# Patient Record
Sex: Female | Born: 1990 | Race: White | Hispanic: No | Smoking: Never smoker
Health system: Southern US, Community
[De-identification: ages and names within clinical notes are randomized; demographics above are authoritative.]

## PROBLEM LIST (undated history)

## (undated) DIAGNOSIS — E739 Lactose intolerance, unspecified: Secondary | ICD-10-CM

## (undated) DIAGNOSIS — R5383 Other fatigue: Secondary | ICD-10-CM

## (undated) DIAGNOSIS — F419 Anxiety disorder, unspecified: Secondary | ICD-10-CM

## (undated) DIAGNOSIS — M549 Dorsalgia, unspecified: Secondary | ICD-10-CM

## (undated) DIAGNOSIS — F32A Depression, unspecified: Secondary | ICD-10-CM

## (undated) DIAGNOSIS — M25569 Pain in unspecified knee: Secondary | ICD-10-CM

## (undated) HISTORY — DX: Depression, unspecified: F32.A

## (undated) HISTORY — DX: Anxiety disorder, unspecified: F41.9

## (undated) HISTORY — DX: Other fatigue: R53.83

## (undated) HISTORY — DX: Pain in unspecified knee: M25.569

## (undated) HISTORY — DX: Lactose intolerance, unspecified: E73.9

## (undated) HISTORY — DX: Dorsalgia, unspecified: M54.9

---

## 2021-03-27 ENCOUNTER — Ambulatory Visit: Payer: Managed Care, Other (non HMO) | Admitting: Family

## 2021-03-27 ENCOUNTER — Encounter: Payer: Self-pay | Admitting: Family

## 2021-03-27 ENCOUNTER — Other Ambulatory Visit: Payer: Self-pay

## 2021-03-27 VITALS — BP 110/64 | HR 77 | Temp 98.1°F | Resp 18 | Ht 64.75 in | Wt 217.0 lb

## 2021-03-27 DIAGNOSIS — Z124 Encounter for screening for malignant neoplasm of cervix: Secondary | ICD-10-CM | POA: Diagnosis not present

## 2021-03-27 DIAGNOSIS — Z13220 Encounter for screening for lipoid disorders: Secondary | ICD-10-CM | POA: Diagnosis not present

## 2021-03-27 DIAGNOSIS — E669 Obesity, unspecified: Secondary | ICD-10-CM

## 2021-03-27 DIAGNOSIS — M545 Low back pain, unspecified: Secondary | ICD-10-CM

## 2021-03-27 DIAGNOSIS — R5383 Other fatigue: Secondary | ICD-10-CM

## 2021-03-27 LAB — COMPREHENSIVE METABOLIC PANEL
ALT: 13 U/L (ref 0–35)
AST: 13 U/L (ref 0–37)
Albumin: 3.9 g/dL (ref 3.5–5.2)
Alkaline Phosphatase: 87 U/L (ref 39–117)
BUN: 11 mg/dL (ref 6–23)
CO2: 25 mEq/L (ref 19–32)
Calcium: 8.6 mg/dL (ref 8.4–10.5)
Chloride: 105 mEq/L (ref 96–112)
Creatinine, Ser: 0.65 mg/dL (ref 0.40–1.20)
GFR: 120.6 mL/min (ref >60.00)
Glucose, Bld: 86 mg/dL (ref 70–99)
Potassium: 4.3 mEq/L (ref 3.5–5.1)
Sodium: 139 mEq/L (ref 135–145)
Total Bilirubin: 0.3 mg/dL (ref 0.2–1.2)
Total Protein: 6.5 g/dL (ref 6.0–8.3)

## 2021-03-27 LAB — CBC WITH DIFFERENTIAL/PLATELET
Basophils Absolute: 0 10*3/uL (ref 0.0–0.1)
Basophils Relative: 0.5 % (ref 0.0–3.0)
Eosinophils Absolute: 0.9 10*3/uL — ABNORMAL HIGH (ref 0.0–0.7)
Eosinophils Relative: 9.4 % — ABNORMAL HIGH (ref 0.0–5.0)
HCT: 40.5 % (ref 36.0–46.0)
Hemoglobin: 13.8 g/dL (ref 12.0–15.0)
Lymphocytes Relative: 27.9 % (ref 12.0–46.0)
Lymphs Abs: 2.7 10*3/uL (ref 0.7–4.0)
MCHC: 34 g/dL (ref 30.0–36.0)
MCV: 89.6 fl (ref 78.0–100.0)
Monocytes Absolute: 0.6 10*3/uL (ref 0.1–1.0)
Monocytes Relative: 6 % (ref 3.0–12.0)
Neutro Abs: 5.5 10*3/uL (ref 1.4–7.7)
Neutrophils Relative %: 56.2 % (ref 43.0–77.0)
Platelets: 305 10*3/uL (ref 150.0–400.0)
RBC: 4.52 Mil/uL (ref 3.87–5.11)
RDW: 12.5 % (ref 11.5–15.5)
WBC: 9.8 10*3/uL (ref 4.0–10.5)

## 2021-03-27 LAB — TSH: TSH: 2.37 u[IU]/mL (ref 0.35–4.50)

## 2021-03-27 LAB — LIPID PANEL
Cholesterol: 133 mg/dL (ref 0–200)
HDL: 43.9 mg/dL (ref >39.00)
LDL Cholesterol: 75 mg/dL (ref 0–99)
NonHDL: 89.5
Total CHOL/HDL Ratio: 3
Triglycerides: 73 mg/dL (ref 0.0–149.0)
VLDL: 14.6 mg/dL (ref 0.0–40.0)

## 2021-03-27 NOTE — Progress Notes (Signed)
Traci Long is a 30 y.o. female with the following history as recorded in Epic[facility]:  There are no problems to display for this patient.   No current outpatient medications on file.   No current facility-administered medications for this visit.    Allergies: Patient has no allergy information on record.  No past medical history on file.   The histories are not reviewed yet. Please review them in the "History" navigator section and refresh this [place].  No family history on file.  Social History   Tobacco Use  . Smoking status: Never Smoker  . Smokeless tobacco: Never Used  Substance Use Topics  . Alcohol use: Not Currently    Subjective:   Presents today as a new patient; has moved to the area in the past year to be closer to family; has 51 month old son; works in Counsellor;  Has been having some intermittent back pain- seems to have gotten worse in the past few weeks since she tripped and fell; no numbness/ tingling; limited relief with OTC medications; is scheduled to see chiropractor later today and notes she is not a "big medicine person." May want to consider seeing PT;   LMP- May 5/ no concerns for being pregnant ( last pap smear 2020)- planning to establish with OB-GYN  Having some increased focus/ fatigue issues since pregnancy 2021; does have family support but is a single parent at least 3 weeks out of the month;     Objective:  Vitals:   03/27/21 0917  BP: 110/64  Pulse: 77  Resp: 18  Temp: 98.1 F (36.7 C)  SpO2: 98%  Weight: 217 lb (98.4 kg)  Height: 5' 4.75" (1.645 m)    General: Well developed, well nourished, in no acute distress  Skin : Warm and dry.  Head: Normocephalic and atraumatic  Eyes: Sclera and conjunctiva clear; pupils round and reactive to light; extraocular movements intact  Ears: External normal; canals clear; tympanic membranes normal  Oropharynx: Pink, supple. No suspicious lesions  Neck: Supple without  thyromegaly, adenopathy  Lungs: Respirations unlabored; clear to auscultation bilaterally without wheeze, rales, rhonchi  CVS exam: normal rate and regular rhythm.  Musculoskeletal: No deformities; no active joint inflammation  Extremities: No edema, cyanosis, clubbing  Vessels: Symmetric bilaterally  Neurologic: Alert and oriented; speech intact; face symmetrical; moves all extremities well; CNII-XII intact without focal deficit   Assessment:  1. Obesity, unspecified classification, unspecified obesity type, unspecified whether serious comorbidity present   2. Cervical cancer screening   3. Other fatigue   4. Lipid screening   5. Low back pain without sciatica, unspecified back pain laterality, unspecified chronicity     Plan:  1. Refer to [facility] to discuss treatment options; encouraged to try to remain committed to exercise/ healthy eating; 2. Refer to GYN- needs to establish with new provider; 3. Update labs today;  4. Check lipid panel today; 5. Patient defers medication at this time- will see her chiropractor today and will let them manage X-rays for her; she may call back if she prefers to see PT in addition and referral can be done.   This visit occurred during the SARS-CoV-2 public health emergency.  Safety protocols were in place, including screening questions prior to the visit, additional usage of staff PPE, and extensive cleaning of exam room while observing appropriate contact time as indicated for disinfecting solutions.     No follow-ups on file.  Orders Placed This Encounter  Procedures  . CBC with Differential/Platelet  . Comp Met (CMET)  . TSH  . Lipid panel  . Amb Ref to Medical Weight Management    Referral Priority:   Routine    Referral Type:   Consultation    Number of Visits Requested:   1  . Ambulatory referral to Obstetrics / Gynecology    Referral Priority:   Routine    Referral Type:   Consultation    Referral Reason:    Specialty Services Required    Requested Specialty:   Obstetrics and Gynecology    Number of Visits Requested:   1    Requested Prescriptions    No prescriptions requested or ordered in this encounter

## 2021-05-21 ENCOUNTER — Encounter: Payer: Managed Care, Other (non HMO) | Admitting: Obstetrics & Gynecology

## 2021-06-27 DIAGNOSIS — Z0289 Encounter for other administrative examinations: Secondary | ICD-10-CM

## 2021-06-29 ENCOUNTER — Ambulatory Visit (INDEPENDENT_AMBULATORY_CARE_PROVIDER_SITE_OTHER): Payer: Managed Care, Other (non HMO) | Admitting: Family Medicine

## 2021-06-29 ENCOUNTER — Other Ambulatory Visit: Payer: Self-pay

## 2021-06-29 ENCOUNTER — Encounter (INDEPENDENT_AMBULATORY_CARE_PROVIDER_SITE_OTHER): Payer: Self-pay | Admitting: Family Medicine

## 2021-06-29 VITALS — BP 95/67 | HR 78 | Temp 98.0°F | Ht 64.0 in | Wt 217.0 lb

## 2021-06-29 DIAGNOSIS — Z9189 Other specified personal risk factors, not elsewhere classified: Secondary | ICD-10-CM | POA: Diagnosis not present

## 2021-06-29 DIAGNOSIS — Z6837 Body mass index (BMI) 37.0-37.9, adult: Secondary | ICD-10-CM

## 2021-06-29 DIAGNOSIS — F39 Unspecified mood [affective] disorder: Secondary | ICD-10-CM | POA: Diagnosis not present

## 2021-06-29 DIAGNOSIS — R0602 Shortness of breath: Secondary | ICD-10-CM | POA: Diagnosis not present

## 2021-06-29 DIAGNOSIS — R5383 Other fatigue: Secondary | ICD-10-CM | POA: Diagnosis not present

## 2021-06-29 DIAGNOSIS — Z1331 Encounter for screening for depression: Secondary | ICD-10-CM

## 2021-06-29 DIAGNOSIS — E739 Lactose intolerance, unspecified: Secondary | ICD-10-CM | POA: Diagnosis not present

## 2021-06-29 NOTE — Progress Notes (Signed)
Chief Complaint:   OBESITY ANALUCIA HUSH (MR# [id number]) is a 30 y.o. female who presents for evaluation and treatment of obesity and related comorbidities. Current BMI is Body mass index is 37.25 kg/m. Sugar has been struggling with her weight for many years and has been unsuccessful in either losing weight, maintaining weight loss, or reaching her healthy weight goal.  Kadesia is divorced and has a 58 year old son. 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 She notes intermit fasting and calorie counting worked best in the past. She craves pastries, carbohydrates. She snacks on cookies, chips, and nuts. She drinks calorie beverages.  Aracelly is currently in the action stage of change and ready to dedicate time achieving and maintaining a healthier weight. Velvet is interested in becoming our patient and working on intensive lifestyle modifications including (but not limited to) diet and exercise for weight loss.  Yajahira's habits were reviewed today and are as follows: Her family eats meals together, she struggles with family and or coworkers weight loss sabotage, her desired weight loss is 77 lbs, she has been heavy most of her life, she started gaining weight during pregnancy, her heaviest weight ever was 222 lbs pounds, she has significant food cravings issues, she snacks frequently in the evenings, she skips meals frequently, she is frequently drinking liquids with calories, she frequently makes poor food choices, she frequently eats larger portions than normal, she has binge eating behaviors, and she struggles with emotional eating.  Depression Screen Sundae's Food and Mood (modified PHQ-9) score was 14.  Depression screen PHQ 2/9 06/29/2021  Decreased Interest 1  Down, Depressed, Hopeless 2  PHQ - 2 Score 3  Altered sleeping 1  Tired, decreased energy 3  Change in appetite 1  Feeling bad or failure about yourself  2  Trouble concentrating 3  Moving slowly or  fidgety/restless 0  Suicidal thoughts 1  PHQ-9 Score 14  Difficult doing work/chores Somewhat difficult   Subjective:   1. Other fatigue Mckenze admits to daytime somnolence and admits to waking up still tired. Patent has a history of symptoms of daytime fatigue. Jamirra generally gets 4 or 5 hours of sleep per night, and states that she has nightime awakenings. Snoring is present. Apneic episodes are not present. Epworth Sleepiness Score is 6. EKG within normal limits, negative ABN appreciated. Normal sinus rhythm with heart rate 88-85 BPM.  2. Shortness of breath on exertion Raynah notes increasing shortness of breath with exercising and seems to be worsening over time with weight gain. She notes getting out of breath sooner with activity than she used to. This has not gotten worse recently. Siddhi denies shortness of breath at rest or orthopnea.  3. Lactose intolerance Kirston can eat milk, cheese, etc. She doesn't have to change her habits much at all.  4. Mood disorder (HCC), emotional eating Weltha notes depression and anxiety which was worse. She is taking Lexapro as needed. She not taking it now because she doesn't feel she is depressed or anxious. She states she used Lexapro for focus.  5. At risk for malnutrition Terrilee is at increased risk for malnutrition due to current dietary habits.  Assessment/Plan:   Orders Placed This Encounter  Procedures   Vitamin B12   CBC with Differential/Platelet   Comprehensive metabolic panel   Folate   Hemoglobin A1c   Insulin, random   T3   T4, free   TSH   VITAMIN D 25  Hydroxy (Vit-D Deficiency, Fractures)   Lipid Panel With LDL/HDL Ratio   EKG 12-Lead    There are no discontinued medications.   No orders of the defined types were placed in this encounter.    1. Other fatigue Allien does feel that her weight is causing her energy to be lower than it should be. Fatigue may be related to obesity, depression or many other causes. Labs will  be ordered, and in the meanwhile, Vendetta will focus on self care including making healthy food choices, increasing physical activity and focusing on stress reduction.  - EKG 12-Lead - Vitamin B12 - CBC with Differential/Platelet - Comprehensive metabolic panel - Folate - Hemoglobin A1c - Insulin, random - T3 - T4, free - TSH - VITAMIN D 25 Hydroxy (Vit-D Deficiency, Fractures) - Lipid Panel With LDL/HDL Ratio  2. Shortness of breath on exertion Revella does feel that she gets out of breath more easily that she used to when she exercises. Trachelle's shortness of breath appears to be obesity related and exercise induced. She has agreed to work on weight loss and gradually increase exercise to treat her exercise induced shortness of breath. Will continue to monitor closely.  3. Lactose intolerance Etienne will follow her prudent nutritional plan, and will monitor. She doesn't think it will be an issue eating on the plan.  4. Mood disorder (HCC), emotional eating Behavior modification techniques were discussed today to help Yehudit deal with her emotional/non-hunger eating behaviors. Mazelle's symptoms are well controlled currently. We will continue to monitor and will adjust her treatment plan as needed. Orders and follow up as documented in patient record.   5. Depression screening Johnetta had a positive depression screening. Depression is commonly associated with obesity and often results in emotional eating behaviors. We will monitor this closely and work on CBT to help improve the non-hunger eating patterns. Referral to Psychology may be required if no improvement is seen as she continues in our clinic.  6. At risk for malnutrition Selma was given extensive malnutrition prevention education and counseling today of more than 23 minutes.  Counseled her that malnutrition refers to inappropriate nutrients or not the right balance of nutrients for optimal health.  Discussed with Medrith K Hewson that it is  absolutely possible to be malnourished but yet obese.  Risk factors, including but not limited to, inappropriate dietary choices, difficulty with obtaining food due to physical or financial limitations, and various physical and mental health conditions were reviewed with Chastelyn K Cossin.   7. Class 2 severe obesity with serious comorbidity and body mass index (BMI) of 37.0 to 37.9 in adult, unspecified obesity type (HCC) Marjorie is currently in the action stage of change and her goal is to continue with weight loss efforts. I recommend Macaela begin the structured treatment plan as follows:  She has agreed to the Category 2 Plan.  Exercise goals: As is.   Behavioral modification strategies: meal planning and cooking strategies, keeping healthy foods in the home, and planning for success.  She was informed of the importance of frequent follow-up visits to maximize her success with intensive lifestyle modifications for her multiple health conditions. She was informed we would discuss her lab results at her next visit unless there is a critical issue that needs to be addressed sooner. Jurnee agreed to keep her next visit at the agreed upon time to discuss these results.  Objective:   Blood pressure 95/67, pulse 78, temperature 98 F (36.7 C), height 5\' 4"  (1.626  m), weight 217 lb (98.4 kg), SpO2 96 %. Body mass index is 37.25 kg/m.  EKG: Normal sinus rhythm, rate 87 BPM.  Indirect Calorimeter completed today shows a VO2 of 247 and a REE of 1699.  Her calculated basal metabolic rate is 6144 thus her basal metabolic rate is worse than expected.  General: Cooperative, alert, well developed, in no acute distress. HEENT: Conjunctivae and lids unremarkable. Cardiovascular: Regular rhythm.  Lungs: Normal work of breathing. Neurologic: No focal deficits.   Lab Results  Component Value Date   CREATININE 0.65 03/27/2021   BUN 11 03/27/2021   NA 139 03/27/2021   K 4.3 03/27/2021   CL 105 03/27/2021    CO2 25 03/27/2021   Lab Results  Component Value Date   ALT 13 03/27/2021   AST 13 03/27/2021   ALKPHOS 87 03/27/2021   BILITOT 0.3 03/27/2021   No results found for: HGBA1C No results found for: INSULIN Lab Results  Component Value Date   TSH 2.37 03/27/2021   Lab Results  Component Value Date   CHOL 133 03/27/2021   HDL 43.90 03/27/2021   LDLCALC 75 03/27/2021   TRIG 73.0 03/27/2021   CHOLHDL 3 03/27/2021   Lab Results  Component Value Date   WBC 9.8 03/27/2021   HGB 13.8 03/27/2021   HCT 40.5 03/27/2021   MCV 89.6 03/27/2021   PLT 305.0 03/27/2021   No results found for: IRON, TIBC, FERRITIN  Attestation Statements:   Reviewed by clinician on day of visit: allergies, medications, problem list, medical history, surgical history, family history, social history, and previous encounter notes.   Trude Mcburney, am acting as transcriptionist for [employer], DO.  I have reviewed the above documentation for accuracy and completeness, and I agree with the above. Carlye Grippe, D.O.  The 21st Century Cures Act was signed into law in 2016 which includes the topic of electronic health records.  This provides immediate access to information in MyChart.  This includes consultation notes, operative notes, office notes, lab results and pathology reports.  If you have any questions about what you read please let us know at your next visit so we can discuss your concerns and take corrective action if need be.  We are right here with you.

## 2021-07-09 ENCOUNTER — Other Ambulatory Visit (HOSPITAL_COMMUNITY)
Admission: RE | Admit: 2021-07-09 | Discharge: 2021-07-09 | Disposition: A | Discharge status: Home / Self Care | Payer: Managed Care, Other (non HMO) | Source: Ambulatory Visit | Attending: Obstetrics & Gynecology | Admitting: Obstetrics & Gynecology

## 2021-07-09 ENCOUNTER — Encounter: Payer: Self-pay | Admitting: Obstetrics & Gynecology

## 2021-07-09 ENCOUNTER — Ambulatory Visit (INDEPENDENT_AMBULATORY_CARE_PROVIDER_SITE_OTHER): Payer: Managed Care, Other (non HMO) | Admitting: Obstetrics & Gynecology

## 2021-07-09 ENCOUNTER — Other Ambulatory Visit: Payer: Self-pay

## 2021-07-09 VITALS — BP 92/66 | HR 75 | Ht 64.0 in | Wt 219.0 lb

## 2021-07-09 DIAGNOSIS — Z3009 Encounter for other general counseling and advice on contraception: Secondary | ICD-10-CM

## 2021-07-09 DIAGNOSIS — Z01419 Encounter for gynecological examination (general) (routine) without abnormal findings: Secondary | ICD-10-CM | POA: Diagnosis not present

## 2021-07-09 NOTE — Progress Notes (Signed)
Subjective:     Traci Long is a 30 y.o. female here for a routine exam. G1P1001. S/p c/s 18 months prev. Recenlty divorced and moved to [place] from [place]. Current complaints: prev had Skyla. Liked it however had severe cramps that made her pass out. She had it removed and 2 months later got pregnant. Not currently sexually active. No current GYN issues.    Gynecologic History Patient's last menstrual period was 06/24/2021. Contraception: none Last Pap: 1 year prev Last mammogram: n/a.   Obstetric History OB History  Gravida Para Term Preterm AB Living  1 1 1     1   SAB IAB Ectopic Multiple Live Births          1    # Outcome Date GA Lbr Len/2nd Weight Sex Delivery Anes PTL Lv  1 Term 2021 [redacted]w[redacted]d   M CS-LTranv EPI N LIV     The following portions of the patient's history were reviewed and updated as appropriate: allergies, current medications, past family history, past medical history, past social history, past surgical history, and problem list.  Review of Systems Pertinent items are noted in HPI.    Objective:  BP 92/66   Pulse 75   Ht 5\' 4"  (1.626 m)   Wt 219 lb (99.3 kg)   LMP 06/24/2021   BMI 37.59 kg/m   General Appearance:    Alert, cooperative, no distress, appears stated age  Head:    Normocephalic, without obvious abnormality, atraumatic  Eyes:    conjunctiva/corneas clear, EOM's intact, both eyes  Ears:    Normal external ear canals, both ears  Nose:   Nares normal, septum midline, mucosa normal, no drainage    or sinus tenderness  Throat:   Lips, mucosa, and tongue normal; teeth and gums normal  Neck:   Supple, symmetrical, trachea midline, no adenopathy;    thyroid:  no enlargement/tenderness/nodules  Back:     Symmetric, no curvature, ROM normal, no CVA tenderness  Lungs:     Clear to auscultation bilaterally, respirations unlabored  Chest Wall:    No tenderness or deformity   Heart:    Regular rate and rhythm, S1 and S2 normal, no murmur, rub   or  gallop  Breast Exam:    No tenderness, masses, or nipple abnormality  Abdomen:     Soft, non-tender, bowel sounds active all four quadrants,    no masses, no organomegaly  Genitalia:    Normal female without lesion, discharge or tenderness     Extremities:   Extremities normal, atraumatic, no cyanosis or edema  Pulses:   2+ and symmetric all extremities  Skin:   Skin color, texture, turgor normal, no rashes or lesions     Assessment:    Healthy female exam.  Contraception counseling. Reviewed options for future state. Questions answered.    Plan:    Diagnoses and all orders for this visit:  Well female exam with routine gynecological exam -     Cytology - PAP( [facility])  Encounter for counseling regarding contraception   Pt to f/u if she becomes sexually active and wants for LnIUD.  F/u in 1 year or sooner prn   Ottis Vacha L. Harraway-Smith, M.D., 

## 2021-07-10 LAB — CYTOLOGY - PAP: Diagnosis: NEGATIVE

## 2021-07-12 LAB — COMPREHENSIVE METABOLIC PANEL
ALT: 15 IU/L (ref 0–32)
AST: 18 IU/L (ref 0–40)
Albumin/Globulin Ratio: 1.5 (ref 1.2–2.2)
Albumin: 4.1 g/dL (ref 3.9–5.0)
Alkaline Phosphatase: 98 IU/L (ref 44–121)
BUN/Creatinine Ratio: 13 (ref 9–23)
BUN: 10 mg/dL (ref 6–20)
Bilirubin Total: 0.2 mg/dL (ref 0.0–1.2)
CO2: 20 mmol/L (ref 20–29)
Calcium: 8.9 mg/dL (ref 8.7–10.2)
Chloride: 104 mmol/L (ref 96–106)
Creatinine, Ser: 0.76 mg/dL (ref 0.57–1.00)
Globulin, Total: 2.7 g/dL (ref 1.5–4.5)
Glucose: 85 mg/dL (ref 65–99)
Potassium: 4.3 mmol/L (ref 3.5–5.2)
Sodium: 139 mmol/L (ref 134–144)
Total Protein: 6.8 g/dL (ref 6.0–8.5)
eGFR: 110 mL/min/{1.73_m2} (ref >59)

## 2021-07-12 LAB — CBC WITH DIFFERENTIAL/PLATELET
Basophils Absolute: 0 10*3/uL (ref 0.0–0.2)
Basos: 1 %
EOS (ABSOLUTE): 0.4 10*3/uL (ref 0.0–0.4)
Eos: 6 %
Hematocrit: 46.8 % — ABNORMAL HIGH (ref 34.0–46.6)
Hemoglobin: 14.5 g/dL (ref 11.1–15.9)
Immature Grans (Abs): 0.1 10*3/uL (ref 0.0–0.1)
Immature Granulocytes: 1 %
Lymphocytes Absolute: 1.8 10*3/uL (ref 0.7–3.1)
Lymphs: 24 %
MCH: 30 pg (ref 26.6–33.0)
MCHC: 31 g/dL — ABNORMAL LOW (ref 31.5–35.7)
MCV: 97 fL (ref 79–97)
Monocytes Absolute: 0.3 10*3/uL (ref 0.1–0.9)
Monocytes: 4 %
Neutrophils Absolute: 5 10*3/uL (ref 1.4–7.0)
Neutrophils: 64 %
Platelets: 365 10*3/uL (ref 150–450)
RBC: 4.84 x10E6/uL (ref 3.77–5.28)
RDW: 12.6 % (ref 11.7–15.4)
WBC: 7.7 10*3/uL (ref 3.4–10.8)

## 2021-07-12 LAB — INSULIN, RANDOM: INSULIN: 8.9 u[IU]/mL (ref 2.6–24.9)

## 2021-07-12 LAB — T3: T3, Total: 116 ng/dL (ref 71–180)

## 2021-07-12 LAB — SPECIMEN STATUS REPORT

## 2021-07-12 LAB — T4, FREE: Free T4: 1.14 ng/dL (ref 0.82–1.77)

## 2021-07-12 LAB — LIPID PANEL WITH LDL/HDL RATIO
Cholesterol, Total: 167 mg/dL (ref 100–199)
HDL: 46 mg/dL (ref >39)
LDL Chol Calc (NIH): 100 mg/dL — ABNORMAL HIGH (ref 0–99)
LDL/HDL Ratio: 2.2 ratio (ref 0.0–3.2)
Triglycerides: 114 mg/dL (ref 0–149)
VLDL Cholesterol Cal: 21 mg/dL (ref 5–40)

## 2021-07-12 LAB — HEMOGLOBIN A1C
Est. average glucose Bld gHb Est-mCnc: 103 mg/dL
Hgb A1c MFr Bld: 5.2 % (ref 4.8–5.6)

## 2021-07-12 LAB — VITAMIN B12: Vitamin B-12: 639 pg/mL (ref 232–1245)

## 2021-07-12 LAB — TSH: TSH: 2.82 u[IU]/mL (ref 0.450–4.500)

## 2021-07-12 LAB — FOLATE: Folate: 5.2 ng/mL (ref >3.0)

## 2021-07-12 LAB — VITAMIN D 25 HYDROXY (VIT D DEFICIENCY, FRACTURES): Vit D, 25-Hydroxy: 21.9 ng/mL — ABNORMAL LOW (ref 30.0–100.0)

## 2021-07-13 ENCOUNTER — Ambulatory Visit (INDEPENDENT_AMBULATORY_CARE_PROVIDER_SITE_OTHER): Payer: Managed Care, Other (non HMO) | Admitting: Family Medicine

## 2021-07-13 ENCOUNTER — Other Ambulatory Visit: Payer: Self-pay

## 2021-07-13 ENCOUNTER — Encounter (INDEPENDENT_AMBULATORY_CARE_PROVIDER_SITE_OTHER): Payer: Self-pay | Admitting: Family Medicine

## 2021-07-13 ENCOUNTER — Other Ambulatory Visit (HOSPITAL_BASED_OUTPATIENT_CLINIC_OR_DEPARTMENT_OTHER): Payer: Self-pay

## 2021-07-13 VITALS — BP 96/66 | HR 75 | Temp 98.2°F | Ht 64.0 in | Wt 214.0 lb

## 2021-07-13 DIAGNOSIS — F39 Unspecified mood [affective] disorder: Secondary | ICD-10-CM | POA: Diagnosis not present

## 2021-07-13 DIAGNOSIS — E8881 Metabolic syndrome: Secondary | ICD-10-CM

## 2021-07-13 DIAGNOSIS — Z9189 Other specified personal risk factors, not elsewhere classified: Secondary | ICD-10-CM

## 2021-07-13 DIAGNOSIS — E7849 Other hyperlipidemia: Secondary | ICD-10-CM | POA: Diagnosis not present

## 2021-07-13 DIAGNOSIS — E66812 Obesity, class 2: Secondary | ICD-10-CM

## 2021-07-13 DIAGNOSIS — Z6837 Body mass index (BMI) 37.0-37.9, adult: Secondary | ICD-10-CM

## 2021-07-13 DIAGNOSIS — E559 Vitamin D deficiency, unspecified: Secondary | ICD-10-CM | POA: Diagnosis not present

## 2021-07-13 DIAGNOSIS — E88819 Insulin resistance, unspecified: Secondary | ICD-10-CM

## 2021-07-13 MED ORDER — VITAMIN D (ERGOCALCIFEROL) 1.25 MG (50000 UNIT) PO CAPS
50000.0000 [IU] | ORAL_CAPSULE | ORAL | 0 refills | Status: DC
  Filled 2021-07-13: qty 4, 28d supply, fill #0

## 2021-07-16 NOTE — Progress Notes (Signed)
Chief Complaint:   OBESITY Traci Long is here to discuss her progress with her obesity treatment plan along with follow-up of her obesity related diagnoses. Traci Long is on the Category 2 Plan and states she is following her eating plan approximately 30% of the time. Traci Long states she is walking 30 minutes 2 times per week.  Today's visit was #: 2 Starting weight: 217 lbs Starting date: 06/29/2021 Today's weight: 214 lbs Today's date: 07/13/2021 Total lbs lost to date: 3 Total lbs lost since last in-office visit: 3  Interim History: Traci Long is here today for her first follow-up office visit since starting the program with [place].  All blood work/ lab tests that were recently ordered by myself or an outside provider were reviewed with patient today per their request.   Extended time was spent counseling her on all new disease processes that were discovered or preexisting ones that are affected by BMI.  she understands that many of these abnormalities will need to monitored regularly along with the current treatment plan of prudent dietary changes, in which we are making each and every office visit, to improve these health parameters. Traci Long had bad cravings because she couldn't eat all the foods on plan and found it very restrictive and boring. She doesn't like eating some things.    Subjective:   1. Insulin resistance New. Discussed labs with patient today. Traci Long has some cravings for bread and crackers. Medication: None  2. Vitamin D deficiency New. Discussed labs with patient today. She is currently taking no vitamin D supplement. She denies nausea, vomiting or muscle weakness.  Lab Results  Component Value Date   VD25OH 21.9 (L) 07/02/2021   3. Other hyperlipidemia New. Discussed labs with patient today. Pt's LDL is slightly elevated. Medication: None  4. Mood disorder (HCC), emotional eating Discussed labs with patient today. While working from home, pt mindlessly eats and snacks  a lot. She has been off Lexapro for about a month and doesn't feel she needs any medication right now.  5. At risk for diabetes mellitus Traci Long is at higher than average risk for developing diabetes due to new onset insulin resistance.   Assessment/Plan:  No orders of the defined types were placed in this encounter.   Medications Discontinued During This Encounter  Medication Reason   Vitamin D, Ergocalciferol, (DRISDOL) 1.25 MG (50000 UNIT) CAPS capsule Reorder     Meds ordered this encounter  Medications   Vitamin D, Ergocalciferol, (DRISDOL) 1.25 MG (50000 UNIT) CAPS capsule    Sig: Take 1 capsule (50,000 Units total) by mouth every 7 (seven) days.    Dispense:  4 capsule    Refill:  0     1. Insulin resistance Traci Long will continue to work on weight loss, exercise, and decreasing simple carbohydrates to help decrease the risk of diabetes. Traci Long agreed to follow-up with [place] as directed to closely monitor her progress. Extensive discussion and education was done and handouts provided. We also discussed Metformin and handout provided on medication. Pt will read and let us know if she'd like to start the medication.  2. Vitamin D deficiency Low Vitamin D level contributes to fatigue and are associated with obesity, breast, and colon cancer. She agrees to start prescription Vitamin D 50,000 IU every week and will follow-up for routine testing of Vitamin D, at least 2-3 times per year to avoid over-replacement.  Start- Vitamin D, Ergocalciferol, (DRISDOL) 1.25 MG (50000 UNIT) CAPS capsule; Take 1 capsule (50,000  Units total) by mouth every 7 (seven) days.  Dispense: 4 capsule; Refill: 0  3. Other hyperlipidemia Cardiovascular risk and specific lipid/LDL goals reviewed.  We discussed several lifestyle modifications today and Traci Long will continue to work on diet, exercise and weight loss efforts. Orders and follow up as documented in patient record. Decrease saturated and trans fats.  Counseling done and we will continue to monitor every 4-6 months.  Counseling Intensive lifestyle modifications are the first line treatment for this issue. Dietary changes: Increase soluble fiber. Decrease simple carbohydrates. Exercise changes: Moderate to vigorous-intensity aerobic activity 150 minutes per week if tolerated. Lipid-lowering medications: see documented in medical record.  4. Mood disorder (HCC), emotional eating Refer to Dr. Dewaine Conger. Would recommend Wellbutrin to PCP for mood management in the future, if they felt it was an appropriate choices.  5. At risk for diabetes mellitus Traci Long was given approximately 23 minutes of diabetes education and counseling today. We discussed intensive lifestyle modifications today with an emphasis on weight loss as well as increasing exercise and decreasing simple carbohydrates in her diet. We also reviewed medication options with an emphasis on risk versus benefit of those discussed.   Repetitive spaced learning was employed today to elicit superior memory formation and behavioral change.  6. Obesity with current BMI of 36.9  Traci Long is currently in the action stage of change. As such, her goal is to continue with weight loss efforts. She has agreed to keeping a food journal and adhering to recommended goals of 1200-1300 calories and 95+ grams protein- using category 2 as a guide.   Bring journaling log to next OV. Pt was counseled that there is a lot of variation to meal plan. Food prep discussed with pt.  Exercise goals:  As is  Behavioral modification strategies: decreasing simple carbohydrates, increasing water intake, and keeping a strict food journal.  Traci Long has agreed to follow-up with our clinic in 2 weeks. She was informed of the importance of frequent follow-up visits to maximize her success with intensive lifestyle modifications for her multiple health conditions.   Objective:   Blood pressure 96/66, pulse 75, temperature 98.2  F (36.8 C), height 5\' 4"  (1.626 m), last menstrual period 06/24/2021, SpO2 97 %. Body mass index is 37.59 kg/m.  General: Cooperative, alert, well developed, in no acute distress. HEENT: Conjunctivae and lids unremarkable. Cardiovascular: Regular rhythm.  Lungs: Normal work of breathing. Neurologic: No focal deficits.   Lab Results  Component Value Date   CREATININE 0.76 07/02/2021   BUN 10 07/02/2021   NA 139 07/02/2021   K 4.3 07/02/2021   CL 104 07/02/2021   CO2 20 07/02/2021   Lab Results  Component Value Date   ALT 15 07/02/2021   AST 18 07/02/2021   ALKPHOS 98 07/02/2021   BILITOT 0.2 07/02/2021   Lab Results  Component Value Date   HGBA1C 5.2 07/02/2021   Lab Results  Component Value Date   INSULIN 8.9 07/02/2021   Lab Results  Component Value Date   TSH 2.820 07/02/2021   Lab Results  Component Value Date   CHOL 167 07/02/2021   HDL 46 07/02/2021   LDLCALC 100 (H) 07/02/2021   TRIG 114 07/02/2021   CHOLHDL 3 03/27/2021   Lab Results  Component Value Date   VD25OH 21.9 (L) 07/02/2021   Lab Results  Component Value Date   WBC 7.7 07/02/2021   HGB 14.5 07/02/2021   HCT 46.8 (H) 07/02/2021   MCV 97 07/02/2021  PLT 365 07/02/2021    Attestation Statements:   Reviewed by clinician on day of visit: allergies, medications, problem list, medical history, surgical history, family history, social history, and previous encounter notes.  Edmund Hilda, CMA, am acting as transcriptionist for [employer], DO.  I have reviewed the above documentation for accuracy and completeness, and I agree with the above. Carlye Grippe, D.O.  The 21st Century Cures Act was signed into law in 2016 which includes the topic of electronic health records.  This provides immediate access to information in MyChart.  This includes consultation notes, operative notes, office notes, lab results and pathology reports.  If you have any questions about what you read  please let us know at your next visit so we can discuss your concerns and take corrective action if need be.  We are right here with you.

## 2021-07-23 ENCOUNTER — Other Ambulatory Visit (HOSPITAL_BASED_OUTPATIENT_CLINIC_OR_DEPARTMENT_OTHER): Payer: Self-pay

## 2021-07-24 ENCOUNTER — Other Ambulatory Visit: Payer: Self-pay

## 2021-07-24 ENCOUNTER — Encounter (INDEPENDENT_AMBULATORY_CARE_PROVIDER_SITE_OTHER): Payer: Self-pay | Admitting: Family Medicine

## 2021-07-24 ENCOUNTER — Other Ambulatory Visit (HOSPITAL_BASED_OUTPATIENT_CLINIC_OR_DEPARTMENT_OTHER): Payer: Self-pay

## 2021-07-24 ENCOUNTER — Ambulatory Visit (INDEPENDENT_AMBULATORY_CARE_PROVIDER_SITE_OTHER): Payer: Managed Care, Other (non HMO) | Admitting: Family Medicine

## 2021-07-24 VITALS — BP 114/77 | HR 74 | Temp 97.4°F | Ht 64.0 in | Wt 220.0 lb

## 2021-07-24 DIAGNOSIS — Z9189 Other specified personal risk factors, not elsewhere classified: Secondary | ICD-10-CM | POA: Diagnosis not present

## 2021-07-24 DIAGNOSIS — E559 Vitamin D deficiency, unspecified: Secondary | ICD-10-CM

## 2021-07-24 DIAGNOSIS — Z6837 Body mass index (BMI) 37.0-37.9, adult: Secondary | ICD-10-CM

## 2021-07-24 MED ORDER — VITAMIN D (ERGOCALCIFEROL) 1.25 MG (50000 UNIT) PO CAPS
50000.0000 [IU] | ORAL_CAPSULE | ORAL | 0 refills | Status: DC
  Filled 2021-07-24: qty 4, 28d supply, fill #0

## 2021-07-24 NOTE — Progress Notes (Signed)
Chief Complaint:   OBESITY Traci Long is here to discuss her progress with her obesity treatment plan along with follow-up of her obesity related diagnoses. Traci Long is on keeping a food journal and adhering to recommended goals of 1200-1300 calories and 95+ grams of protein daily and states she is following her eating plan approximately 70-80% of the time. [name] states she is doing yoga, walking, and zumba for 60 minutes 3 times per week.  Today's visit was #: 3 Starting weight: 217 lbs Starting date: 06/29/2021 Today's weight: 220 lbs Today's date: 07/24/2021 Total lbs lost to date: 0 Total lbs lost since last in-office visit:0  Interim History: Traci Long states she did better than before even though she gained weight today.  Journaling has helped, and she did it for 9 days and was surprised at the calorie content and carbohydrate content of the items. She was over in calories every day and under in protein half of the days.  She eats out 3-4 days per week, which makes it challenging.    Subjective:   1. Vitamin D deficiency Shirle is currently taking prescription vitamin D 50,000 IU each week. She started her Vit D last week. She denies nausea, vomiting or muscle weakness.  2. At risk for impaired metabolic function Traci Long is at increased risk for impaired metabolic function due to insulin resistance.   Assessment/Plan:  No orders of the defined types were placed in this encounter.   Medications Discontinued During This Encounter  Medication Reason   escitalopram (LEXAPRO) 10 MG tablet Error   Vitamin D, Ergocalciferol, (DRISDOL) 1.25 MG (50000 UNIT) CAPS capsule Reorder     Meds ordered this encounter  Medications   Vitamin D, Ergocalciferol, (DRISDOL) 1.25 MG (50000 UNIT) CAPS capsule    Sig: Take 1 capsule (50,000 Units total) by mouth every 7 (seven) days.    Dispense:  4 capsule    Refill:  0    30 d supply;  ** OV for RF **   Do not send RF request     1. Vitamin D  deficiency Low Vitamin D level contributes to fatigue and are associated with obesity, breast, and colon cancer. We will refill prescription Vitamin D for 1 month. Aamya will follow-up for routine testing of Vitamin D, at least 2-3 times per year to avoid over-replacement.  - Vitamin D, Ergocalciferol, (DRISDOL) 1.25 MG (50000 UNIT) CAPS capsule; Take 1 capsule (50,000 Units total) by mouth every 7 (seven) days.  Dispense: 4 capsule; Refill: 0  2. At risk for impaired metabolic function Traci Long was given approximately 9 minutes of impaired  metabolic function prevention counseling today. We discussed intensive lifestyle modifications today with an emphasis on specific nutrition and exercise instructions and strategies.   Repetitive spaced learning was employed today to elicit superior memory formation and behavioral change.  3. Obesity with current BMI of 37.8 Traci Long is currently in the action stage of change. As such, her goal is to continue with weight loss efforts. She has agreed to keeping a food journal and adhering to recommended goals of 1200-1300 calories and 95+ grams of protein daily.   Exercise goals: For substantial health benefits, adults should do at least 150 minutes (2 hours and 30 minutes) a week of moderate-intensity, or 75 minutes (1 hour and 15 minutes) a week of vigorous-intensity aerobic physical activity, or an equivalent combination of moderate- and vigorous-intensity aerobic activity. Aerobic activity should be performed in episodes of at least 10 minutes, and preferably,  it should be spread throughout the week.  Behavioral modification strategies: increasing lean protein intake, decreasing simple carbohydrates, decreasing liquid calories, and keeping a strict food journal.  Traci Long has agreed to follow-up with our clinic in 2 to 3 weeks. She was informed of the importance of frequent follow-up visits to maximize her success with intensive lifestyle modifications for her multiple  health conditions.   Objective:   Blood pressure 114/77, pulse 74, temperature (!) 97.4 F (36.3 C), height 5\' 4"  (1.626 m), weight 220 lb (99.8 kg), last menstrual period 06/24/2021, SpO2 98 %. Body mass index is 37.76 kg/m.  General: Cooperative, alert, well developed, in no acute distress. HEENT: Conjunctivae and lids unremarkable. Cardiovascular: Regular rhythm.  Lungs: Normal work of breathing. Neurologic: No focal deficits.   Lab Results  Component Value Date   CREATININE 0.76 07/02/2021   BUN 10 07/02/2021   NA 139 07/02/2021   K 4.3 07/02/2021   CL 104 07/02/2021   CO2 20 07/02/2021   Lab Results  Component Value Date   ALT 15 07/02/2021   AST 18 07/02/2021   ALKPHOS 98 07/02/2021   BILITOT 0.2 07/02/2021   Lab Results  Component Value Date   HGBA1C 5.2 07/02/2021   Lab Results  Component Value Date   INSULIN 8.9 07/02/2021   Lab Results  Component Value Date   TSH 2.820 07/02/2021   Lab Results  Component Value Date   CHOL 167 07/02/2021   HDL 46 07/02/2021   LDLCALC 100 (H) 07/02/2021   TRIG 114 07/02/2021   CHOLHDL 3 03/27/2021   Lab Results  Component Value Date   VD25OH 21.9 (L) 07/02/2021   Lab Results  Component Value Date   WBC 7.7 07/02/2021   HGB 14.5 07/02/2021   HCT 46.8 (H) 07/02/2021   MCV 97 07/02/2021   PLT 365 07/02/2021   No results found for: IRON, TIBC, FERRITIN  Attestation Statements:   Reviewed by clinician on day of visit: allergies, medications, problem list, medical history, surgical history, family history, social history, and previous encounter notes.   09/01/2021, am acting as transcriptionist for Trude Mcburney, DO.  I have reviewed the above documentation for accuracy and completeness, and I agree with the above. [employer], D.O.  The 21st Century Cures Act was signed into law in 2016 which includes the topic of electronic health records.  This provides immediate access to information in  MyChart.  This includes consultation notes, operative notes, office notes, lab results and pathology reports.  If you have any questions about what you read please let 2017 know at your next visit so we can discuss your concerns and take corrective action if need be.  We are right here with you.
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
## 2021-08-02 ENCOUNTER — Other Ambulatory Visit (HOSPITAL_BASED_OUTPATIENT_CLINIC_OR_DEPARTMENT_OTHER): Payer: Self-pay

## 2021-08-07 ENCOUNTER — Telehealth (INDEPENDENT_AMBULATORY_CARE_PROVIDER_SITE_OTHER): Payer: 59 | Admitting: Psychology

## 2021-08-07 DIAGNOSIS — F3289 Other specified depressive episodes: Secondary | ICD-10-CM

## 2021-08-07 NOTE — Progress Notes (Incomplete)
  Office: [phone]  /  Fax: [phone]    Date: August 21, 2021   Appointment Start Time: *** Duration: *** minutes Provider: Lawerance Cruel, Psy.D. Type of Session: Individual Therapy  Location of Patient: {gbptloc:23249} Location of Provider: Provider's Home (private office) Type of Contact: Telepsychological Visit via MyChart Video Visit  Session Content: Traci Long is a 30 y.o. female presenting for a follow-up appointment to address the previously established treatment goal of increasing coping skills.Today's appointment was a telepsychological visit due to COVID-19. Traci Long provided verbal consent for today's telepsychological appointment and she is aware she is responsible for securing confidentiality on her end of the session. Prior to proceeding with today's appointment, Traci Long's physical location at the time of this appointment was obtained as well a phone number she could be reached at in the event of technical difficulties. Traci Long and this provider participated in today's telepsychological service.   This provider conducted a brief check-in. *** Traci Long was receptive to today's appointment as evidenced by openness to sharing, responsiveness to feedback, and {gbreceptiveness:23401}.  Mental Status Examination:  Appearance: {Appearance:22431} Behavior: {Behavior:22445} Mood: {gbmood:21757} Affect: {Affect:22436} Speech: {Speech:22432} Eye Contact: {Eye Contact:22433} Psychomotor Activity: {Motor Activity:22434} Gait: {gbgait:23404} Thought Process: {thought process:22448}  Thought Content/Perception: {disturbances:22451} Orientation: {Orientation:22437} Memory/Concentration: {gbcognition:22449} Insight/Judgment: {Insight:22446}  Interventions:  {Interventions for Progress Notes:23405}  DSM-5 Diagnosis(es): F32.89 Other Specified Depressive Disorder, Emotional Eating Behaviors  Treatment Goal & Progress: During the initial appointment with this provider, the following  treatment goal was established: increase coping skills. Traci Long has demonstrated progress in her goal as evidenced by {gbtxprogress:22839}. Traci Long also {gbtxprogress2:22951}.  Plan: The next appointment will be scheduled in {gbweeks:21758}, which will be {gbtxmodality:23402}. The next session will focus on {Plan for Next Appointment:23400}.

## 2021-08-08 ENCOUNTER — Encounter (INDEPENDENT_AMBULATORY_CARE_PROVIDER_SITE_OTHER): Payer: Self-pay | Admitting: Bariatrics

## 2021-08-08 ENCOUNTER — Other Ambulatory Visit: Payer: Self-pay

## 2021-08-08 ENCOUNTER — Telehealth (INDEPENDENT_AMBULATORY_CARE_PROVIDER_SITE_OTHER): Payer: Managed Care, Other (non HMO) | Admitting: Bariatrics

## 2021-08-08 DIAGNOSIS — Z6837 Body mass index (BMI) 37.0-37.9, adult: Secondary | ICD-10-CM

## 2021-08-08 DIAGNOSIS — E785 Hyperlipidemia, unspecified: Secondary | ICD-10-CM

## 2021-08-08 DIAGNOSIS — E7849 Other hyperlipidemia: Secondary | ICD-10-CM | POA: Diagnosis not present

## 2021-08-08 DIAGNOSIS — E8881 Metabolic syndrome: Secondary | ICD-10-CM | POA: Diagnosis not present

## 2021-08-08 NOTE — Progress Notes (Signed)
TeleHealth Visit:  Due to the COVID-19 pandemic, this visit was completed with telemedicine (audio/video) technology to reduce patient and provider exposure as well as to preserve personal protective equipment.   Traci Long has verbally consented to this TeleHealth visit. The patient is located at home, the provider is located at the [facility] office. The participants in this visit include the listed provider and patient. The visit was conducted today via video.   Chief Complaint: OBESITY Traci Long is here to discuss her progress with her obesity treatment plan along with follow-up of her obesity related diagnoses. Traci Long is on keeping a food journal and adhering to recommended goals of 1200-1300 calories and 95 grams of protein and states she is following her eating plan approximately 70% of the time. Traci Long states she is walking for 30-45 minutes 2-3 times per week.  Today's visit was #: 4 Starting weight: 217 lbs Starting date: 06/29/2021  Interim History: Traci Long is sick today with a fever, vomiting and diarrhea. She requested a video visit. She usually sees Dr. Sharee Holster. She thinks she did lose weight.   Subjective:   1. Insulin resistance [facility] is not currently on medications.   2. Other hyperlipidemia Traci Long is not on medications currently.   Assessment/Plan:   1. Insulin resistance Traci Long will continue to work on weight loss, activities, exercise, and decreasing simple carbohydrates to help decrease the risk of diabetes. Traci Long agreed to follow-up with [place] as directed to closely monitor her progress.  2. Other hyperlipidemia Cardiovascular risk and specific lipid/LDL goals reviewed.  We discussed several lifestyle modifications today and Adel will continue to work on diet, exercise and weight loss efforts. Traci Long will have zero trans fats and she will decrease saturated fats. She will increase healthy fats Traci Long. Orders and follow up as documented in patient record.    Counseling Intensive lifestyle modifications are the first line treatment for this issue. Dietary changes: Increase soluble fiber. Decrease simple carbohydrates. Exercise changes: Moderate to vigorous-intensity aerobic activity 150 minutes per week if tolerated. Lipid-lowering medications: see documented in medical record.   3. Obesity with current BMI of 37.7 Traci Long is currently in the action stage of change. As such, her goal is to continue with weight loss efforts. She has agreed to keeping a food journal and adhering to recommended goals of 1200-1300 calories and 95 plus grams of protein.   Traci Long will continue meal planning. She will adhere strongly to the plan. She will increase water intake. She will turn on water app on her phone.  Exercise goals:  As is.  Behavioral modification strategies: increasing lean protein intake, decreasing simple carbohydrates, increasing vegetables, increasing water intake, decreasing eating out, no skipping meals, meal planning and cooking strategies, keeping healthy foods in the home, and planning for success.  Traci Long has agreed to follow-up with our clinic in 2 weeks. She was informed of the importance of frequent follow-up visits to maximize her success with intensive lifestyle modifications for her multiple health conditions.  Objective:   VITALS: Per patient if applicable, see vitals. GENERAL: Alert and in no acute distress. CARDIOPULMONARY: No increased WOB. Speaking in clear sentences.  PSYCH: Pleasant and cooperative. Speech normal rate and rhythm. Affect is appropriate. Insight and judgement are appropriate. Attention is focused, linear, and appropriate.  NEURO: Oriented as arrived to appointment on time with no prompting.   Lab Results  Component Value Date   CREATININE 0.76 07/02/2021   BUN 10 07/02/2021   NA 139  07/02/2021   K 4.3 07/02/2021   CL 104 07/02/2021   CO2 20 07/02/2021   Lab Results  Component Value Date   ALT 15  07/02/2021   AST 18 07/02/2021   ALKPHOS 98 07/02/2021   BILITOT 0.2 07/02/2021   Lab Results  Component Value Date   HGBA1C 5.2 07/02/2021   Lab Results  Component Value Date   INSULIN 8.9 07/02/2021   Lab Results  Component Value Date   TSH 2.820 07/02/2021   Lab Results  Component Value Date   CHOL 167 07/02/2021   HDL 46 07/02/2021   LDLCALC 100 (H) 07/02/2021   TRIG 114 07/02/2021   CHOLHDL 3 03/27/2021   Lab Results  Component Value Date   VD25OH 21.9 (L) 07/02/2021   Lab Results  Component Value Date   WBC 7.7 07/02/2021   HGB 14.5 07/02/2021   HCT 46.8 (H) 07/02/2021   MCV 97 07/02/2021   PLT 365 07/02/2021   No results found for: IRON, TIBC, FERRITIN  Attestation Statements:   Reviewed by clinician on day of visit: allergies, medications, problem list, medical history, surgical history, family history, social history, and previous encounter notes.  I, Jackson Latino, RMA, am acting as Energy manager for [employer], DO.   I have reviewed the above documentation for accuracy and completeness, and I agree with the above. Corinna Capra, DO

## 2021-08-09 ENCOUNTER — Encounter (INDEPENDENT_AMBULATORY_CARE_PROVIDER_SITE_OTHER): Payer: Self-pay | Admitting: Bariatrics

## 2021-08-09 DIAGNOSIS — E785 Hyperlipidemia, unspecified: Secondary | ICD-10-CM | POA: Insufficient documentation

## 2021-08-09 DIAGNOSIS — E6609 Other obesity due to excess calories: Secondary | ICD-10-CM | POA: Insufficient documentation

## 2021-08-09 DIAGNOSIS — Z6837 Body mass index (BMI) 37.0-37.9, adult: Secondary | ICD-10-CM | POA: Insufficient documentation

## 2021-08-20 NOTE — Progress Notes (Signed)
  Office: [phone]  /  Fax: [phone]    Date: August 27, 2021   Appointment Start Time: 3:30pm Duration: 26 minutes Provider: Lawerance Cruel, Psy.D. Type of Session: Individual Therapy  Location of Patient: Home (private location) Location of Provider: Provider's Home (private office) Type of Contact: Telepsychological Visit via MyChart Video Visit  Session Content:This provider called Traci Long at 3:30pm due to connectivity issues via MyChart Video Visit. Connection was established via Restaurant manager, fast food Visit.    Traci Long is a 30 y.o. female presenting for a follow-up appointment to address the previously established treatment goal of increasing coping skills.Today's appointment was a telepsychological visit due to COVID-19. Traci Long provided verbal consent for today's telepsychological appointment and she is aware she is responsible for securing confidentiality on her end of the session. Prior to proceeding with today's appointment, Traci Long's physical location at the time of this appointment was obtained as well a phone number she could be reached at in the event of technical difficulties. Traci Long and this provider participated in today's telepsychological service.   This provider conducted a brief check-in. Traci Long shared about recent events, including being busy with work and planning an event for her son. She acknowledged engaging in emotional eating behaviors, noting she often feels like she does not have time for herself. As such, psychoeducation regarding the importance of self-care utilizing the oxygen mask metaphor was provided. Additionally, psychoeducation regarding pleasurable activities, including its impact on emotional eating and overall well-being was provided. Traci Long was provided with a handout with various options of pleasurable activities, and was encouraged to engage in one activity a day and additional activities as needed when triggered to emotionally eat. Traci Long agreed. Traci Long provided  verbal consent during today's appointment for this provider to send a handout with pleasurable activities via e-mail. Overall, Traci Long was receptive to today's appointment as evidenced by openness to sharing, responsiveness to feedback, and willingness to engage in pleasurable activities to assist with coping.  Mental Status Examination:  Appearance: well groomed and appropriate hygiene  Behavior: appropriate to circumstances Mood: euthymic Affect: mood congruent Speech: normal in rate, volume, and tone Eye Contact: appropriate Psychomotor Activity: appropriate Gait: unable to assess Thought Process: linear, logical, and goal directed  Thought Content/Perception: denies suicidal and homicidal ideation, plan, and intent, no hallucinations, delusions, bizarre thinking or behavior reported or observed, and denies ideation and engagement in self-injurious behaviors Orientation: time, person, place, and purpose of appointment Memory/Concentration: memory, attention, language, and fund of knowledge intact  Insight/Judgment: fair  Interventions:  Conducted a brief chart review Conducted a risk assessment Provided empathic reflections and validation Employed supportive psychotherapy interventions to facilitate reduced distress and to improve coping skills with identified stressors Psychoeducation provided regarding pleasurable activities Psychoeducation provided regarding self-care  DSM-5 Diagnosis(es): F32.89 Other Specified Depressive Disorder, Emotional Eating Behaviors  Treatment Goal & Progress: During the initial appointment with this provider, the following treatment goal was established: increase coping skills. Progress is limited, as Traci Long has just begun treatment with this provider; however, she is receptive to the interaction and interventions and rapport is being established.   Plan: The next appointment will be scheduled in two weeks, which will be via MyChart Video Visit. The next  session will focus on working towards the established treatment goal. Additionally, Traci Long reported a plan to complete and return paperwork to [facility] to schedule an initial appointment. She also reported a plan to contact her gynecologist as previously discussed.

## 2021-08-21 ENCOUNTER — Telehealth (INDEPENDENT_AMBULATORY_CARE_PROVIDER_SITE_OTHER): Payer: 59 | Admitting: Psychology

## 2021-08-22 ENCOUNTER — Ambulatory Visit (INDEPENDENT_AMBULATORY_CARE_PROVIDER_SITE_OTHER): Payer: Managed Care, Other (non HMO) | Admitting: Family Medicine

## 2021-08-22 ENCOUNTER — Other Ambulatory Visit: Payer: Self-pay

## 2021-08-22 ENCOUNTER — Encounter (INDEPENDENT_AMBULATORY_CARE_PROVIDER_SITE_OTHER): Payer: Self-pay | Admitting: Family Medicine

## 2021-08-22 VITALS — BP 105/74 | HR 71 | Temp 97.4°F | Ht 64.0 in | Wt 217.0 lb

## 2021-08-22 DIAGNOSIS — E8881 Metabolic syndrome: Secondary | ICD-10-CM | POA: Diagnosis not present

## 2021-08-22 DIAGNOSIS — Z6837 Body mass index (BMI) 37.0-37.9, adult: Secondary | ICD-10-CM | POA: Diagnosis not present

## 2021-08-22 DIAGNOSIS — Z9189 Other specified personal risk factors, not elsewhere classified: Secondary | ICD-10-CM | POA: Diagnosis not present

## 2021-08-22 DIAGNOSIS — E559 Vitamin D deficiency, unspecified: Secondary | ICD-10-CM

## 2021-08-22 MED ORDER — METFORMIN HCL 500 MG PO TABS: ORAL_TABLET | ORAL | 0 refills | Status: DC

## 2021-08-22 MED ORDER — VITAMIN D (ERGOCALCIFEROL) 1.25 MG (50000 UNIT) PO CAPS: 50000.0000 [IU] | ORAL_CAPSULE | ORAL | 0 refills | Status: DC

## 2021-08-22 NOTE — Progress Notes (Signed)
Chief Complaint:   OBESITY Traci Long is here to discuss her progress with her obesity treatment plan along with follow-up of her obesity related diagnoses. Traci Long is on keeping a food journal and adhering to recommended goals of 1200-1300 calories and 95 grams protein and states she is following her eating plan approximately 80% of the time. [name] states she is walking and doing yoga 60 minutes 2 times per week.  Today's visit was #: 5 Starting weight: 217 lbs Starting date: 06/29/2021 Today's weight: 217 lbs Today's date: 08/22/2021 Total lbs lost to date: 0 Total lbs lost since last in-office visit: 3  Interim History: Traci Long feels like the tracking/logging is working well for her. She had an off plan eating and drinking weekend and is surprised she lost weight. Pt stays around 1600-1700 cal/day and usually about 80 gr protein/day.  Subjective:   1. Vitamin D deficiency She is currently taking prescription vitamin D 50,000 IU each week. She denies nausea, vomiting or muscle weakness.  2. Insulin resistance [name] reports increased cravings for carbs.  3. At risk for deficient intake of food Brelee is at risk for deficient intake of food due to inadequate protein intake.  Assessment/Plan:  No orders of the defined types were placed in this encounter.   Medications Discontinued During This Encounter  Medication Reason   Vitamin D, Ergocalciferol, (DRISDOL) 1.25 MG (50000 UNIT) CAPS capsule Reorder     Meds ordered this encounter  Medications   Vitamin D, Ergocalciferol, (DRISDOL) 1.25 MG (50000 UNIT) CAPS capsule    Sig: Take 1 capsule (50,000 Units total) by mouth every 7 (seven) days.    Dispense:  4 capsule    Refill:  0    30 d supply;  ** OV for RF **   Do not send RF request   metFORMIN (GLUCOPHAGE) 500 MG tablet    Sig: 1 PO WITH LUNCH DAILY    Dispense:  30 tablet    Refill:  0     1. Vitamin D deficiency Low Vitamin D level contributes to fatigue and are  associated with obesity, breast, and colon cancer. She agrees to continue to take prescription Vitamin D 50,000 IU every week and will follow-up for routine testing of Vitamin D, at least 2-3 times per year to avoid over-replacement.  Refill- Vitamin D, Ergocalciferol, (DRISDOL) 1.25 MG (50000 UNIT) CAPS capsule; Take 1 capsule (50,000 Units total) by mouth every 7 (seven) days.  Dispense: 4 capsule; Refill: 0  2. Insulin resistance Traci Long will start Metformin 500 mg as directed. Counseling done and Metformin handout provided. She will continue to work on weight loss, exercise, and decreasing simple carbohydrates to help decrease the risk of diabetes. Mickelle agreed to follow-up with [place] as directed to closely monitor her progress.  Start- metFORMIN (GLUCOPHAGE) 500 MG tablet; 1 PO WITH LUNCH DAILY  Dispense: 30 tablet; Refill: 0  3. At risk for deficient intake of food Traci Long was given approximately 9 minutes of deficit intake of food prevention counseling today. Traci Long is at risk for eating too few calories based on current food recall. She was encouraged to focus on meeting caloric and protein goals according to her recommended meal plan.   4. Class 2 severe obesity with serious comorbidity and body mass index (BMI) of 37.0 to 37.9 in adult, unspecified obesity type (HCC)  [name] is currently in the action stage of change. As such, her goal is to continue with weight loss efforts. She has agreed  to keeping a food journal and adhering to recommended goals of 1300-1400 calories and 90+ grams protein.   Bring log into next OV. Start Metformin for carb cravings.  Exercise goals:  As is  Behavioral modification strategies: increasing lean protein intake and decreasing simple carbohydrates.  Traci Long has agreed to follow-up with our clinic in 2 weeks. She was informed of the importance of frequent follow-up visits to maximize her success with intensive lifestyle modifications for her multiple health  conditions.   Objective:   Blood pressure 105/74, pulse 71, temperature (!) 97.4 F (36.3 C), height 5\' 4"  (1.626 m), weight 217 lb (98.4 kg), SpO2 98 %. Body mass index is 37.25 kg/m.  General: Cooperative, alert, well developed, in no acute distress. HEENT: Conjunctivae and lids unremarkable. Cardiovascular: Regular rhythm.  Lungs: Normal work of breathing. Neurologic: No focal deficits.   Lab Results  Component Value Date   CREATININE 0.76 07/02/2021   BUN 10 07/02/2021   NA 139 07/02/2021   K 4.3 07/02/2021   CL 104 07/02/2021   CO2 20 07/02/2021   Lab Results  Component Value Date   ALT 15 07/02/2021   AST 18 07/02/2021   ALKPHOS 98 07/02/2021   BILITOT 0.2 07/02/2021   Lab Results  Component Value Date   HGBA1C 5.2 07/02/2021   Lab Results  Component Value Date   INSULIN 8.9 07/02/2021   Lab Results  Component Value Date   TSH 2.820 07/02/2021   Lab Results  Component Value Date   CHOL 167 07/02/2021   HDL 46 07/02/2021   LDLCALC 100 (H) 07/02/2021   TRIG 114 07/02/2021   CHOLHDL 3 03/27/2021   Lab Results  Component Value Date   VD25OH 21.9 (L) 07/02/2021   Lab Results  Component Value Date   WBC 7.7 07/02/2021   HGB 14.5 07/02/2021   HCT 46.8 (H) 07/02/2021   MCV 97 07/02/2021   PLT 365 07/02/2021   Attestation Statements:   Reviewed by clinician on day of visit: allergies, medications, problem list, medical history, surgical history, family history, social history, and previous encounter notes.  09/01/2021, CMA, am acting as transcriptionist for Edmund Hilda, DO.  I have reviewed the above documentation for accuracy and completeness, and I agree with the above. [employer], D.O.  The 21st Century Cures Act was signed into law in 2016 which includes the topic of electronic health records.  This provides immediate access to information in MyChart.  This includes consultation notes, operative notes, office notes, lab  results and pathology reports.  If you have any questions about what you read please let 2017 know at your next visit so we can discuss your concerns and take corrective action if need be.  We are right here with you.

## 2021-08-27 ENCOUNTER — Telehealth (INDEPENDENT_AMBULATORY_CARE_PROVIDER_SITE_OTHER): Payer: 59 | Admitting: Psychology

## 2021-08-27 DIAGNOSIS — F3289 Other specified depressive episodes: Secondary | ICD-10-CM

## 2021-08-27 NOTE — Progress Notes (Signed)
  Office: [phone]  /  Fax: [phone]    Date: September 10, 2021   Appointment Start Time: 11:31am Duration: 27 minutes Provider: Lawerance Cruel, Psy.D. Type of Session: Individual Therapy  Location of Patient: Home (private location) Location of Provider: Provider's Home (private office) Type of Contact: Telepsychological Visit via MyChart Video Visit  Session Content: Traci Long is a 30 y.o. female presenting for a follow-up appointment to address the previously established treatment goal of increasing coping skills.Today's appointment was a telepsychological visit due to COVID-19. Traci Long provided verbal consent for today's telepsychological appointment and she is aware she is responsible for securing confidentiality on her end of the session. Prior to proceeding with today's appointment, Traci Long's physical location at the time of this appointment was obtained as well a phone number she could be reached at in the event of technical difficulties. Traci Long and this provider participated in today's telepsychological service.   This provider conducted a brief check-in. Traci Long shared about recent events, including her son's Baptism. She feels she recently had an opportunity to "re-set" while her son was with his father, noting she engaged in self-care. This provider explored the impact of the aforementioned on her eating habits. She reported she is being more "conscious," but acknowledged she continues to overeat and engage in emotional eating behaviors. Moreover, Traci Long reported she continues to struggle with "wasting food." Further explored and processed. Engaged Traci Long in problem solving. Overall, Traci Long was receptive to today's appointment as evidenced by openness to sharing, responsiveness to feedback, and willingness to implement discussed strategies .  Mental Status Examination:  Appearance: well groomed and appropriate hygiene  Behavior: appropriate to circumstances Mood: euthymic Affect: mood  congruent Speech: normal in rate, volume, and tone Eye Contact: appropriate Psychomotor Activity: appropriate Gait: unable to assess Thought Process: linear, logical, and goal directed  Thought Content/Perception: denies suicidal and homicidal ideation, plan, and intent, no hallucinations, delusions, bizarre thinking or behavior reported or observed, and denies ideation and engagement in self-injurious behaviors Orientation: time, person, place, and purpose of appointment Memory/Concentration: memory, attention, language, and fund of knowledge intact  Insight/Judgment: fair  Interventions:  Conducted a brief chart review Conducted a risk assessment Provided empathic reflections and validation Reviewed content from the previous session Employed supportive psychotherapy interventions to facilitate reduced distress and to improve coping skills with identified stressors Engaged patient in problem solving  DSM-5 Diagnosis(es): F32.89 Other Specified Depressive Disorder, Emotional Eating Behaviors  Treatment Goal & Progress: During the initial appointment with this provider, the following treatment goal was established: increase coping skills. Traci Long has demonstrated progress in her goal as evidenced by engagement in self-care.   Plan: The next appointment will be scheduled in approximately two weeks, which will be via MyChart Video Visit. The next session will focus on working towards the established treatment goal. Additionally, she indicated she started the paperwork for [facility], noting a plan to complete and return before the next appointment with this provider.

## 2021-09-06 ENCOUNTER — Ambulatory Visit (INDEPENDENT_AMBULATORY_CARE_PROVIDER_SITE_OTHER): Payer: Self-pay | Admitting: Family Medicine

## 2021-09-10 ENCOUNTER — Telehealth (INDEPENDENT_AMBULATORY_CARE_PROVIDER_SITE_OTHER): Payer: 59 | Admitting: Psychology

## 2021-09-10 DIAGNOSIS — F3289 Other specified depressive episodes: Secondary | ICD-10-CM | POA: Diagnosis not present

## 2021-09-11 NOTE — Progress Notes (Signed)
  Office: [phone]  /  Fax: [phone]    Date: September 25, 2021   Appointment Start Time: 2:31pm Duration: 31 minutes Provider: Lawerance Cruel, Psy.D. Type of Session: Individual Therapy  Location of Patient: Home (private location) Location of Provider: Provider's Home (private office) Type of Contact: Telepsychological Visit via MyChart Video Visit  Session Content: Traci Long is a 30 y.o. female presenting for a follow-up appointment to address the previously established treatment goal of increasing coping skills.Today's appointment was a telepsychological visit due to COVID-19. Traci Long provided verbal consent for today's telepsychological appointment and she is aware she is responsible for securing confidentiality on her end of the session. Prior to proceeding with today's appointment, Traci Long's physical location at the time of this appointment was obtained as well a phone number she could be reached at in the event of technical difficulties. Traci Long and this provider participated in today's telepsychological service.   This provider conducted a brief check-in. Earlean shared about recent events, including staying busy with work. She discussed plans for her birthday. Of note, Traci Long reported feeling lethargic and feels it is due to burn out. This was further explored and processed. She acknowledged the aforementioned is impacting her eating habits. Psychoeducation provided regarding self-compassion. Traci Long was engaged in a self-compassion exercise to help with eating-related challenges and other ongoing stressors. She was encouraged to regularly ask, "What do I need right now?" Moreover, Traci Long reported she decided not to initiate services with [facility] due to insurance/financial concerns. This provider discussed the option of EAP services and exploring practices that offer sliding scale fees. She was receptive. Traci Long reported she will contact her HR department and provided verbal consent  for this provider to send referral options via e-mail. Overall, Traci Long was receptive to today's appointment as evidenced by openness to sharing, responsiveness to feedback, and  willingness to work toward increasing self-compassion .  Mental Status Examination:  Appearance: well groomed and appropriate hygiene  and casually dressed Behavior: appropriate to circumstances Mood: euthymic Affect: mood congruent Speech: WNL Eye Contact: appropriate Psychomotor Activity: WNL Gait: unable to assess Thought Process: linear, logical, and goal directed and denies suicidal, homicidal, and self-harm ideation, plan and intent  Thought Content/Perception: no hallucinations, delusions, bizarre thinking or behavior endorsed or observed Orientation: AAOx4 Memory/Concentration: memory, attention, language, and fund of knowledge intact  Insight/Judgment: fair  Interventions:  Conducted a brief chart review Conducted a risk assessment Provided empathic reflections and validation Employed supportive psychotherapy interventions to facilitate reduced distress and to improve coping skills with identified stressors Recommended/discussed option for longer-term therapeutic services Psychoeducation provided regarding self-compassion  DSM-5 Diagnosis(es): F32.89 Other Specified Depressive Disorder, Emotional Eating Behaviors  Treatment Goal & Progress: During the initial appointment with this provider, the following treatment goal was established: increase coping skills. Serenitee has demonstrated progress in her goal as evidenced by increased awareness of hunger patterns. Tazia also continues to demonstrate willingness to engage in learned skill(s).  Plan: The next appointment will be scheduled in two weeks, which will be via MyChart Video Visit. The next session will focus on working towards the established treatment goal. Additionally, this provider will e-mail referral options that offer sliding scale.

## 2021-09-18 ENCOUNTER — Other Ambulatory Visit (INDEPENDENT_AMBULATORY_CARE_PROVIDER_SITE_OTHER): Payer: Self-pay | Admitting: Family Medicine

## 2021-09-18 DIAGNOSIS — E559 Vitamin D deficiency, unspecified: Secondary | ICD-10-CM

## 2021-09-25 ENCOUNTER — Telehealth (INDEPENDENT_AMBULATORY_CARE_PROVIDER_SITE_OTHER): Payer: 59 | Admitting: Psychology

## 2021-09-25 DIAGNOSIS — F3289 Other specified depressive episodes: Secondary | ICD-10-CM

## 2021-09-25 NOTE — Progress Notes (Signed)
  Office: [phone]  /  Fax: [phone]    Date: October 09, 2021   Appointment Start Time: 2:31pm Duration: 37 minutes Provider: Lawerance Cruel, Psy.D. Type of Session: Individual Therapy  Location of Patient: Home (private location) Location of Provider: Provider's Home (private office) Type of Contact: Telepsychological Visit via MyChart Video Visit  Session Content: Traci Long is a 30 y.o. female presenting for a follow-up appointment to address the previously established treatment goal of increasing coping skills.Today's appointment was a telepsychological visit due to COVID-19. Traci Long provided verbal consent for today's telepsychological appointment and she is aware she is responsible for securing confidentiality on her end of the session. Prior to proceeding with today's appointment, Traci Long's physical location at the time of this appointment was obtained as well a phone number she could be reached at in the event of technical difficulties. Traci Long and this provider participated in today's telepsychological service.   This provider conducted a brief check-in. Traci Long reported she was sick this past weekend with a "stomach bug." Prior to that, she shared about different activities she engaged in with her son. Traci Long described an improvement in burn out related concerns; however, she described experiencing anticipatory anxiety regarding interacting with her ex-husband during the upcoming holiday. She added it can trigger emotional eating behaviors. Thus, psychoeducation was provided regarding a coping skill that would help her rehearse a plan ahead of time so that she is able to effectively cope (i.e., cope ahead skill). She was engaged in the cope ahead skill. Overall, Traci Long was receptive to today's appointment as evidenced by openness to sharing, responsiveness to feedback, and willingness to implement discussed strategies .  Mental Status Examination:  Appearance: well groomed and appropriate  hygiene  Behavior: appropriate to circumstances Mood: neutral Affect: mood congruent Speech: WNL Eye Contact: appropriate Psychomotor Activity: WNL Gait: unable to assess Thought Process: linear, logical, and goal directed and denies suicidal, homicidal, and self-harm ideation, plan and intent  Thought Content/Perception: no hallucinations, delusions, bizarre thinking or behavior endorsed or observed Orientation: AAOx4 Memory/Concentration: memory, attention, language, and fund of knowledge intact  Insight/Judgment: fair  Interventions:  Conducted a brief chart review Conducted a risk assessment Provided empathic reflections and validation Employed supportive psychotherapy interventions to facilitate reduced distress and to improve coping skills with identified stressors Psychoeducation provided regarding cope ahead skill Engaged pt in the cope ahead skill   DSM-5 Diagnosis(es): F32.89 Other Specified Depressive Disorder, Emotional Eating Behaviors  Treatment Goal & Progress: During the initial appointment with this provider, the following treatment goal was established: increase coping skills. Traci Long has demonstrated progress in her goal as evidenced by increased awareness of hunger patterns. Traci Long also continues to demonstrate willingness to engage in learned skill(s).  Plan: The next appointment will be scheduled in approximately three weeks, which will be via MyChart Video Visit. The next session will focus on working towards the established treatment goal. Traci Long reported she is waiting to hear back from [facility], [facility], [facility] (therapist Dr. Sharee Holster recommended). She indicated she is also looking into EAP services.

## 2021-09-27 ENCOUNTER — Ambulatory Visit (INDEPENDENT_AMBULATORY_CARE_PROVIDER_SITE_OTHER): Payer: Managed Care, Other (non HMO) | Admitting: Family Medicine

## 2021-09-27 ENCOUNTER — Other Ambulatory Visit: Payer: Self-pay

## 2021-09-27 ENCOUNTER — Encounter (INDEPENDENT_AMBULATORY_CARE_PROVIDER_SITE_OTHER): Payer: Self-pay | Admitting: Family Medicine

## 2021-09-27 VITALS — BP 110/79 | HR 73 | Temp 98.0°F | Ht 64.0 in | Wt 219.0 lb

## 2021-09-27 DIAGNOSIS — E8881 Metabolic syndrome: Secondary | ICD-10-CM

## 2021-09-27 DIAGNOSIS — Z9189 Other specified personal risk factors, not elsewhere classified: Secondary | ICD-10-CM

## 2021-09-27 DIAGNOSIS — F3289 Other specified depressive episodes: Secondary | ICD-10-CM | POA: Diagnosis not present

## 2021-09-27 DIAGNOSIS — E559 Vitamin D deficiency, unspecified: Secondary | ICD-10-CM

## 2021-09-27 DIAGNOSIS — Z6837 Body mass index (BMI) 37.0-37.9, adult: Secondary | ICD-10-CM

## 2021-09-27 MED ORDER — VITAMIN D (ERGOCALCIFEROL) 1.25 MG (50000 UNIT) PO CAPS: 50000.0000 [IU] | ORAL_CAPSULE | ORAL | 0 refills | Status: DC

## 2021-10-01 NOTE — Progress Notes (Signed)
Chief Complaint:   OBESITY Traci Long is here to discuss her progress with her obesity treatment plan along with follow-up of her obesity related diagnoses. Traci Long is on keeping a food journal and adhering to recommended goals of 1300-1400 calories and 90 grams protein and states she is following her eating plan approximately 50% of the time. Traci Long states she is doing yoga and going to the gym 30-60 minutes 2 times per week.  Today's visit was #: 6 Starting weight: 217 lbs Starting date: 06/29/2021 Today's weight: 219 lbs Today's date: 09/27/2021 Total lbs lost to date: 0 Total lbs lost since last in-office visit: +2  Interim History: Pt reports her birthday and Thanksgiving were tempting and she ate a lot of "off plan" foods. She did not journal over the holidays. Pt is struggling a bit and feeling overwhelmed with the process right now.   Subjective:   1. Insulin resistance The last couple of weeks went well. Pt had no carb cravings, thus she never started Metformin. One week prior to menses, cravings can be bad.  2. Other depression with emotional eating Pt is working with Dr. Dewaine Conger and it is going well. Dr. Dewaine Conger advises that pt needs additional counseling.  3. Vitamin D deficiency She is currently taking prescription vitamin D 50,000 IU each week. She denies nausea, vomiting or muscle weakness.  4. At risk for side effect of medication [name] is at risk for side effects of medication due to possibly starting Metformin.  Assessment/Plan:  No orders of the defined types were placed in this encounter.   Medications Discontinued During This Encounter  Medication Reason   Vitamin D, Ergocalciferol, (DRISDOL) 1.25 MG (50000 UNIT) CAPS capsule Reorder     Meds ordered this encounter  Medications   Vitamin D, Ergocalciferol, (DRISDOL) 1.25 MG (50000 UNIT) CAPS capsule    Sig: Take 1 capsule (50,000 Units total) by mouth every 7 (seven) days.    Dispense:  4 capsule    Refill:   0    30 d supply;  ** OV for RF **   Do not send RF request     1. Insulin resistance Risks and benefits of Metformin discussed with pt again and all questions answered. She was provided another handout for Metformin and Insulin Resistance. She will decide to use it or not in the future.  2. Other depression with emotional eating Discussed additional options for therapy with pt. She will increase exercise, meditate, and pray. Behavior modification techniques were discussed today to help Traci Long deal with her emotional/non-hunger eating behaviors.  Orders and follow up as documented in patient record.   3. Vitamin D deficiency Low Vitamin D level contributes to fatigue and are associated with obesity, breast, and colon cancer. She agrees to continue to take prescription Vitamin D 50,000 IU every week and will follow-up for routine testing of Vitamin D, at least 2-3 times per year to avoid over-replacement.  Refill- Vitamin D, Ergocalciferol, (DRISDOL) 1.25 MG (50000 UNIT) CAPS capsule; Take 1 capsule (50,000 Units total) by mouth every 7 (seven) days.  Dispense: 4 capsule; Refill: 0  4. At risk for side effect of medication Traci Long was given approximately 9 minutes of drug side effect counseling today.  We discussed side effect possibility and risk versus benefits. Traci Long agreed to the medication and will contact this office if these side effects are intolerable.  Repetitive spaced learning was employed today to elicit superior memory formation and behavioral change.   5.  Class 2 severe obesity with serious comorbidity and body mass index (BMI) of 37.0 to 37.9 in adult, unspecified obesity type (HCC)  Traci Long is currently in the action stage of change. As such, her goal is to continue with weight loss efforts. She has agreed to Change to practicing portion control and making smarter food choices, such as increasing vegetables and decreasing simple carbohydrates.   Stress management/mood management  techniques discussed with pt.  Exercise goals: For substantial health benefits, adults should do at least 150 minutes (2 hours and 30 minutes) a week of moderate-intensity, or 75 minutes (1 hour and 15 minutes) a week of vigorous-intensity aerobic physical activity, or an equivalent combination of moderate- and vigorous-intensity aerobic activity. Aerobic activity should be performed in episodes of at least 10 minutes, and preferably, it should be spread throughout the week.  Behavioral modification strategies: increasing lean protein intake, no skipping meals, emotional eating strategies, celebration eating strategies, and planning for success.  Traci Long has agreed to follow-up with our clinic in 2-4 weeks. She was informed of the importance of frequent follow-up visits to maximize her success with intensive lifestyle modifications for her multiple health conditions.   Objective:   Blood pressure 110/79, pulse 73, temperature 98 F (36.7 C), height 5\' 4"  (1.626 m), weight 219 lb (99.3 kg), SpO2 98 %. Body mass index is 37.59 kg/m.  General: Cooperative, alert, well developed, in no acute distress. HEENT: Conjunctivae and lids unremarkable. Cardiovascular: Regular rhythm.  Lungs: Normal work of breathing. Neurologic: No focal deficits.   Lab Results  Component Value Date   CREATININE 0.76 07/02/2021   BUN 10 07/02/2021   NA 139 07/02/2021   K 4.3 07/02/2021   CL 104 07/02/2021   CO2 20 07/02/2021   Lab Results  Component Value Date   ALT 15 07/02/2021   AST 18 07/02/2021   ALKPHOS 98 07/02/2021   BILITOT 0.2 07/02/2021   Lab Results  Component Value Date   HGBA1C 5.2 07/02/2021   Lab Results  Component Value Date   INSULIN 8.9 07/02/2021   Lab Results  Component Value Date   TSH 2.820 07/02/2021   Lab Results  Component Value Date   CHOL 167 07/02/2021   HDL 46 07/02/2021   LDLCALC 100 (H) 07/02/2021   TRIG 114 07/02/2021   CHOLHDL 3 03/27/2021   Lab Results   Component Value Date   VD25OH 21.9 (L) 07/02/2021   Lab Results  Component Value Date   WBC 7.7 07/02/2021   HGB 14.5 07/02/2021   HCT 46.8 (H) 07/02/2021   MCV 97 07/02/2021   PLT 365 07/02/2021   Attestation Statements:   Reviewed by clinician on day of visit: allergies, medications, problem list, medical history, surgical history, family history, social history, and previous encounter notes.  09/01/2021, CMA, am acting as transcriptionist for Edmund Hilda, DO.  I have reviewed the above documentation for accuracy and completeness, and I agree with the above. [employer], D.O.  The 21st Century Cures Act was signed into law in 2016 which includes the topic of electronic health records.  This provides immediate access to information in MyChart.  This includes consultation notes, operative notes, office notes, lab results and pathology reports.  If you have any questions about what you read please let 2017 know at your next visit so we can discuss your concerns and take corrective action if need be.  We are right here with you.

## 2021-10-09 ENCOUNTER — Telehealth (INDEPENDENT_AMBULATORY_CARE_PROVIDER_SITE_OTHER): Payer: 59 | Admitting: Psychology

## 2021-10-09 DIAGNOSIS — F3289 Other specified depressive episodes: Secondary | ICD-10-CM

## 2021-10-16 NOTE — Progress Notes (Signed)
°  Office: [phone]  /  Fax: [phone]    Date: October 29, 2021   Appointment Start Time: 12:00pm Duration: 28 minutes Provider: Lawerance Cruel, Psy.D. Type of Session: Individual Therapy  Location of Patient: Home (private location) Location of Provider: Provider's Home (private office) Type of Contact: Telepsychological Visit via MyChart Video Visit  Session Content: Traci Long is a 30 y.o. female presenting for a follow-up appointment to address the previously established treatment goal of increasing coping skills.Today's appointment was a telepsychological visit due to COVID-19. Traci Long provided verbal consent for today's telepsychological appointment and she is aware she is responsible for securing confidentiality on her end of the session. Prior to proceeding with today's appointment, Traci Long's physical location at the time of this appointment was obtained as well a phone number she could be reached at in the event of technical difficulties. Traci Long and this provider participated in today's telepsychological service.   This provider conducted a brief check-in. Traci Long shared about recent events, including being "crazy busy" with work. She indicated she is "overwhelmed" and experiencing limited sleep. She acknowledged the aforementioned has impacted her eating habits. Processed associated thoughts and feelings. Reviewed self-compassion and discussed further. During the appointment, she identified she would enjoy a cup of tea, which she went and made for herself. Engaged Traci Long in a self-hugging exercise to increase self-compassion. Overall, Traci Long was receptive to today's appointment as evidenced by openness to sharing, responsiveness to feedback, and  willingness to continue increasing self-compassion .   Mental Status Examination:  Appearance: well groomed and appropriate hygiene  Behavior: appropriate to circumstances Mood: sad Affect: mood congruent; tearful  Speech: WNL Eye Contact:  appropriate Psychomotor Activity: WNL Gait: unable to assess Thought Process: linear, logical, and goal directed and denies suicidal, homicidal, and self-harm ideation, plan and intent  Thought Content/Perception: no hallucinations, delusions, bizarre thinking or behavior endorsed or observed Orientation: AAOx4 Memory/Concentration: memory, attention, language, and fund of knowledge intact  Insight/Judgment: fair  Interventions:  Conducted a brief chart review Conducted a risk assessment Provided empathic reflections and validation Reviewed content from the previous session Employed supportive psychotherapy interventions to facilitate reduced distress and to improve coping skills with identified stressors Psychoeducation provided regarding self-compassion Engaged Traci Long in a self-compassion exercise  DSM-5 Diagnosis(es): F32.89 Other Specified Depressive Disorder, Emotional Eating Behaviors  Treatment Goal & Progress: During the initial appointment with this provider, the following treatment goal was established: increase coping skills. Traci Long has demonstrated progress in her goal as evidenced by increased awareness of hunger patterns. Traci Long also continues to demonstrate willingness to engage in learned skill(s).  Plan: The next appointment will be scheduled in approximately two weeks, which will be via MyChart Video Visit. The next session will focus on working towards the established treatment goal. Traci Long reported she will meet with a new therapist via EAP services on Monday, November 05, 2021.

## 2021-10-29 ENCOUNTER — Telehealth (INDEPENDENT_AMBULATORY_CARE_PROVIDER_SITE_OTHER): Payer: 59 | Admitting: Psychology

## 2021-10-29 DIAGNOSIS — F3289 Other specified depressive episodes: Secondary | ICD-10-CM

## 2021-10-30 NOTE — Progress Notes (Signed)
Entered in error

## 2021-11-01 ENCOUNTER — Ambulatory Visit (INDEPENDENT_AMBULATORY_CARE_PROVIDER_SITE_OTHER): Payer: Self-pay | Admitting: Family Medicine

## 2021-11-08 ENCOUNTER — Other Ambulatory Visit: Payer: Self-pay

## 2021-11-08 ENCOUNTER — Telehealth (INDEPENDENT_AMBULATORY_CARE_PROVIDER_SITE_OTHER): Payer: 59 | Admitting: Family Medicine

## 2021-11-08 ENCOUNTER — Encounter (INDEPENDENT_AMBULATORY_CARE_PROVIDER_SITE_OTHER): Payer: Self-pay | Admitting: Family Medicine

## 2021-11-08 VITALS — Ht 64.0 in

## 2021-11-08 DIAGNOSIS — E8881 Metabolic syndrome: Secondary | ICD-10-CM | POA: Diagnosis not present

## 2021-11-08 DIAGNOSIS — F5089 Other specified eating disorder: Secondary | ICD-10-CM

## 2021-11-08 DIAGNOSIS — Z9189 Other specified personal risk factors, not elsewhere classified: Secondary | ICD-10-CM

## 2021-11-08 DIAGNOSIS — E669 Obesity, unspecified: Secondary | ICD-10-CM | POA: Diagnosis not present

## 2021-11-08 DIAGNOSIS — E559 Vitamin D deficiency, unspecified: Secondary | ICD-10-CM | POA: Diagnosis not present

## 2021-11-08 DIAGNOSIS — Z6837 Body mass index (BMI) 37.0-37.9, adult: Secondary | ICD-10-CM

## 2021-11-08 MED ORDER — VITAMIN D (ERGOCALCIFEROL) 1.25 MG (50000 UNIT) PO CAPS: 50000.0000 [IU] | ORAL_CAPSULE | ORAL | 0 refills | Status: DC

## 2021-11-13 ENCOUNTER — Telehealth (INDEPENDENT_AMBULATORY_CARE_PROVIDER_SITE_OTHER): Payer: Self-pay | Admitting: Psychology

## 2021-11-13 ENCOUNTER — Encounter (INDEPENDENT_AMBULATORY_CARE_PROVIDER_SITE_OTHER): Payer: 59 | Admitting: Psychology

## 2021-11-13 ENCOUNTER — Encounter (INDEPENDENT_AMBULATORY_CARE_PROVIDER_SITE_OTHER): Payer: Self-pay

## 2021-11-13 NOTE — Telephone Encounter (Signed)
°  Office: [phone]  /  Fax: [phone]  Date of Encounter: November 13, 2021  Time of Encounter: 2:33pm Duration of Encounter: ~7 minute(s)  Provider: Lawerance Cruel, PsyD  CONTENT: This provider called [place] at 2:33pm as she did not present for today's appointment. She forgot about today's appointment, but indicated she could join. She joined the appointment at 2:34pm and shared she was in [place] at her father's home. This provider explained she is only licensed in [place]. Colena acknowledged understanding and was receptive to rescheduling. A brief risk assessment was completed. Trezure denied experiencing suicidal, self-harm and homicidal ideation, plan, or intent since the last appointment with this provider. All questions/concerns addressed.   PLAN: A no show fee will not be applied for this appointment. Amira is scheduled for an appointment on November 26, 2021 at 11am.

## 2021-11-15 NOTE — Progress Notes (Signed)
TeleHealth Visit:  Due to the COVID-19 pandemic, this visit was completed with telemedicine (audio/video) technology to reduce patient and provider exposure as well as to preserve personal protective equipment.   Traci Long has verbally consented to this TeleHealth visit. The patient is located at home, the provider is located at the [facility] office. The participants in this visit include the listed provider and patient. The visit was conducted today via MyChart video.   Chief Complaint: OBESITY Traci Long is here to discuss her progress with her obesity treatment plan along with follow-up of her obesity related diagnoses. Traci Long is on practicing portion control and making smarter food choices, such as increasing vegetables and decreasing simple carbohydrates and states she is following her eating plan approximately 60% of the time. Traci Long states she is at the gym and walking for 30 minutes 2-3 times per week.  Today's visit was #: 7 Starting weight: 217 lbs Starting date: 06/29/2021  Interim History: Traci Long reports RN, dry cough, and bad congestion for 2-3 days. No fever but she notes watery eyes. She is not hungry while sick, thus she is not eating much. She feels she has maintained her weight. Live video was done today.   Subjective:   1. Insulin resistance [name] took metformin for 2 days, but she has too many side effects and she had to stop. She couldn't cut her tablet in half even though we recommended that she do so.  2. Vitamin D deficiency Traci Long is currently taking prescription vitamin D 50,000 IU each week. She denies nausea, vomiting or muscle weakness.  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 Dewaine CongerBarker and she feels things are improving with therapy.  4. At risk of exposure to COVID-19 virus Traci Long is at risk of COVID exposure due to illness and community  exposure.   Assessment/Plan:  No orders of the defined types were placed in this encounter.   Medications Discontinued During This Encounter  Medication Reason   Vitamin D, Ergocalciferol, (DRISDOL) 1.25 MG (50000 UNIT) CAPS capsule Reorder     Meds ordered this encounter  Medications   Vitamin D, Ergocalciferol, (DRISDOL) 1.25 MG (50000 UNIT) CAPS capsule    Sig: Take 1 capsule (50,000 Units total) by mouth every 7 (seven) days.    Dispense:  4 capsule    Refill:  0    30 d supply;  ** OV for RF **   Do not send RF request     1. Insulin resistance Counseling was done. Once Traci Long is feeling better she is to take metformin 1/2 tablet PO with lunch daily. She must follow her prudent nutritional plan and avoid simple carbohydrates. She is to never take her medicine on an empty stomach.  2. Vitamin D deficiency We will refill prescription Vitamin D for 1 month. Traci Long will follow-up for routine testing of Vitamin D, at least 2-3 times per year to avoid over-replacement.  - Vitamin D, Ergocalciferol, (DRISDOL) 1.25 MG (50000 UNIT) CAPS capsule; Take 1 capsule (50,000 Units total) by mouth every 7 (seven) days.  Dispense: 4 capsule; Refill: 0  3. Other disorder of eating with emotional eating Traci Long will continue cognitive behavorial therapy with Dr. Dewaine CongerBarker. When she is well, she will get back to exercise to help her stress levels.  4. At risk of exposure to COVID-19 virus Traci Long was given approximately 15 minutes of  COVID prevention counseling today Counseling COVID-19 is a respiratory infection that is caused by a virus. It can cause serious infections, such as pneumonia, acute respiratory distress syndrome, acute respiratory failure, or sepsis. You are more likely to develop a serious illness if you are 45 years of age or older, have a weak immune system, live in a nursing home, have chronic disease, or have obesity. Get vaccinated as soon as they are available to you.  For our most  current information, please visit [email or web address] Wash your hands often with soap and water for 20 seconds. If soap and water are not available, use alcohol-based hand sanitizer. Wear a face mask. Make sure your mask covers your nose and mouth. Maintain at least 6 feet distance from others when in public.  Get help right away if You have trouble breathing, chest pain, confusion, or other concerning symptoms.  Repetitive spaced learning was employed today to elicit superior memory formation and behavioral change.   5. Obesity with current BMI of 37.59 [name] is currently in the action stage of change. As such, her goal is to continue with weight loss efforts. She has agreed to practicing portion control and making smarter food choices, such as increasing vegetables and decreasing simple carbohydrates.   Exercise goals: Resume exercise once she is better.  Behavioral modification strategies: increasing lean protein intake, decreasing simple carbohydrates, and increasing water intake.  Traci Long has agreed to follow-up with our clinic in 2 to 3 weeks. She was informed of the importance of frequent follow-up visits to maximize her success with intensive lifestyle modifications for her multiple health conditions.  Objective:   VITALS: Per patient if applicable, see vitals. GENERAL: Alert and in no acute distress. CARDIOPULMONARY: No increased WOB. Speaking in clear sentences.  PSYCH: Pleasant and cooperative. Speech normal rate and rhythm. Affect is appropriate. Insight and judgement are appropriate. Attention is focused, linear, and appropriate.  NEURO: Oriented as arrived to appointment on time with no prompting.   Lab Results  Component Value Date   CREATININE 0.76 07/02/2021   BUN 10 07/02/2021   NA 139 07/02/2021   K 4.3 07/02/2021   CL 104 07/02/2021   CO2 20 07/02/2021   Lab Results  Component Value Date   ALT 15 07/02/2021   AST 18 07/02/2021   ALKPHOS 98  07/02/2021   BILITOT 0.2 07/02/2021   Lab Results  Component Value Date   HGBA1C 5.2 07/02/2021   Lab Results  Component Value Date   INSULIN 8.9 07/02/2021   Lab Results  Component Value Date   TSH 2.820 07/02/2021   Lab Results  Component Value Date   CHOL 167 07/02/2021   HDL 46 07/02/2021   LDLCALC 100 (H) 07/02/2021   TRIG 114 07/02/2021   CHOLHDL 3 03/27/2021   Lab Results  Component Value Date   VD25OH 21.9 (L) 07/02/2021   Lab Results  Component Value Date   WBC 7.7 07/02/2021   HGB 14.5 07/02/2021   HCT 46.8 (H) 07/02/2021   MCV 97 07/02/2021   PLT 365 07/02/2021   No results found for: IRON, TIBC, FERRITIN  Attestation Statements:   Reviewed by clinician on day of visit: allergies, medications, problem list, medical history, surgical history, family history, social history, and previous encounter notes.   Trude Mcburney, am acting as transcriptionist for [employer], DO.  I have reviewed the above documentation for accuracy and completeness, and I agree with the above. Carlye Grippe, D.O.  The  21st Century Cures Act was signed into law in 2016 which includes the topic of electronic health records.  This provides immediate access to information in MyChart.  This includes consultation notes, operative notes, office notes, lab results and pathology reports.  If you have any questions about what you read please let us know at your next visit so we can discuss your concerns and take corrective action if need be.  We are right here with you.

## 2021-11-26 ENCOUNTER — Telehealth (INDEPENDENT_AMBULATORY_CARE_PROVIDER_SITE_OTHER): Payer: 59 | Admitting: Psychology

## 2021-11-26 DIAGNOSIS — F3289 Other specified depressive episodes: Secondary | ICD-10-CM | POA: Diagnosis not present

## 2021-11-26 NOTE — Progress Notes (Signed)
Office: [phone]  /  Fax: [phone]    Date: November 26, 2021    Appointment Start Time: 11:00am Duration: 34 minutes Provider: Glennie Isle, Psy.D. Type of Session: Individual Therapy  Location of Patient: Home (private location) Location of Provider: Provider's Home (private office) Type of Contact: Telepsychological Visit via MyChart Video Visit  Session Content: Traci Long is a 31 y.o. female presenting for a follow-up appointment to address the previously established treatment goal of increasing coping skills.Today's appointment was a telepsychological visit due to COVID-19. Traci Long provided verbal consent for today's telepsychological appointment and she is aware she is responsible for securing confidentiality on her end of the session. Prior to proceeding with today's appointment, Traci Long's physical location at the time of this appointment was obtained as well a phone number she could be reached at in the event of technical difficulties. Traci Long and this provider participated in today's telepsychological service.   This provider conducted a brief check-in. Traci Long shared about her recent trip to [place], noting it was very helpful to have family support. She also indicated she deleted various social media apps, adding she already read two books. She overall described an increase in self-care. Positive reinforcement was provided. Traci Long further discussed an increase in physical activity and improvement in eating habits overall. She described a decrease in emotional eating behaviors. Psychoeducation regarding mindfulness was provided to further assist with coping. A handout was provided to Traci Long with further information regarding mindfulness, including exercises. This provider also explained the benefit of mindfulness as it relates to emotional eating. Traci Long was encouraged to engage in the provided exercises between now and the next appointment with this provider. Traci Long agreed. During today's  appointment, Traci Long was led through a mindfulness exercise involving her senses. Traci Long provided verbal consent during today's appointment for this provider to send a handout about mindfulness via e-mail. Furthermore, termination planning was discussed. Traci Long was receptive to a follow-up appointment in 3-4 weeks and an additional follow-up/termination appointment in 3-4 weeks after that. Overall, Traci Long was receptive to today's appointment as evidenced by openness to sharing, responsiveness to feedback, and willingness to engage in mindfulness exercises to assist with coping.  Mental Status Examination:  Appearance: well groomed and appropriate hygiene  Behavior: appropriate to circumstances Mood: neutral Affect: mood congruent Speech: WNL Eye Contact: appropriate Psychomotor Activity: WNL Gait: unable to assess Thought Process: linear, logical, and goal directed and denies suicidal, homicidal, and self-harm ideation, plan and intent  Thought Content/Perception: no hallucinations, delusions, bizarre thinking or behavior endorsed or observed Orientation: AAOx4 Memory/Concentration: memory, attention, language, and fund of knowledge intact  Insight: good Judgment: good  Interventions:  Conducted a brief chart review Conducted a risk assessment Provided empathic reflections and validation Provided positive reinforcement Employed supportive psychotherapy interventions to facilitate reduced distress and to improve coping skills with identified stressors Psychoeducation provided regarding mindfulness Engaged patient in mindfulness exercise(s) Discussed termination planning  DSM-5 Diagnosis(es): F32.89 Other Specified Depressive Disorder, Emotional Eating Behaviors  Treatment Goal & Progress: During the initial appointment with this provider, the following treatment goal was established: increase coping skills. Traci Long has demonstrated progress in her goal as evidenced by increased awareness of  hunger patterns and reduction in emotional eating behaviors . Traci Long also continues to demonstrate willingness to engage in learned skill(s).  Plan: The next appointment will be scheduled in approximately 4 weeks, which will be via MyChart Video Visit. The next session will focus on working towards the established treatment goal. Traci Long reported she will be initiating therapeutic  services with [facility], [facility], [facility] at the end of this month.

## 2021-11-27 ENCOUNTER — Telehealth (INDEPENDENT_AMBULATORY_CARE_PROVIDER_SITE_OTHER): Payer: Self-pay | Admitting: Psychology

## 2021-12-11 ENCOUNTER — Other Ambulatory Visit: Payer: Self-pay

## 2021-12-11 ENCOUNTER — Ambulatory Visit (INDEPENDENT_AMBULATORY_CARE_PROVIDER_SITE_OTHER): Payer: Managed Care, Other (non HMO) | Admitting: Family

## 2021-12-11 ENCOUNTER — Ambulatory Visit (HOSPITAL_BASED_OUTPATIENT_CLINIC_OR_DEPARTMENT_OTHER)
Admission: RE | Admit: 2021-12-11 | Discharge: 2021-12-11 | Disposition: A | Discharge status: Home / Self Care | Payer: Managed Care, Other (non HMO) | Source: Ambulatory Visit | Attending: Family | Admitting: Family

## 2021-12-11 VITALS — BP 124/82 | HR 89 | Temp 97.6°F | Resp 18 | Ht 64.0 in | Wt 223.4 lb

## 2021-12-11 DIAGNOSIS — M25562 Pain in left knee: Secondary | ICD-10-CM | POA: Insufficient documentation

## 2021-12-11 DIAGNOSIS — Z0001 Encounter for general adult medical examination with abnormal findings: Secondary | ICD-10-CM

## 2021-12-11 DIAGNOSIS — Z0000 Encounter for general adult medical examination without abnormal findings: Secondary | ICD-10-CM

## 2021-12-11 NOTE — Progress Notes (Signed)
Traci Long is a 31 y.o. female with the following history as recorded in EpicCare:  Patient Active Problem List   Diagnosis Date Noted   Class 2 obesity due to excess calories with body mass index (BMI) of 37.0 to 37.9 in adult 08/09/2021   Hyperlipidemia 08/09/2021    Current Outpatient Medications  Medication Sig Dispense Refill   metFORMIN (GLUCOPHAGE) 500 MG tablet 1 PO WITH LUNCH DAILY 30 tablet 0   Vitamin D, Ergocalciferol, (DRISDOL) 1.25 MG (50000 UNIT) CAPS capsule Take 1 capsule (50,000 Units total) by mouth every 7 (seven) days. 4 capsule 0   No current facility-administered medications for this visit.    Allergies: Patient has no known allergies.  Past Medical History:  Diagnosis Date   Anxiety    Back pain    Depression    Fatigue    Knee pain    Lactose intolerance     Past Surgical History:  Procedure Laterality Date   CESAREAN SECTION      Family History  Problem Relation Age of Onset   Depression Mother    Anxiety disorder Mother    Obesity Mother    Cancer Mother    Kidney disease Mother    Obesity Father    Hyperlipidemia Father     Social History   Tobacco Use   Smoking status: Never   Smokeless tobacco: Never  Substance Use Topics   Alcohol use: Not Currently    Subjective:   Presents for yearly CPE; up to date on GYN needs; working with [facility]- currently taking Metformin but only once per week;  Also under care of therapist through [facility]- actually starting work with a new therapist tomorrow; feels that she is getting good information with the weight loss program but her mental health needs are keeping her from being able to implement.  Concerned about persisting left knee pain/ right ankle; left knee problematic x 1 year since she fell on the knee;   LMP 12/11/2021  Review of Systems  Constitutional:  Positive for malaise/fatigue.  HENT: Negative.    Eyes: Negative.   Respiratory:  Negative.    Cardiovascular: Negative.   Gastrointestinal: Negative.   Genitourinary: Negative.   Musculoskeletal:  Positive for joint pain.  Skin: Negative.   Neurological: Negative.   Psychiatric/Behavioral: Negative.         Objective:  Vitals:   12/11/21 1046  BP: 124/82  Pulse: 89  Resp: 18  Temp: 97.6 F (36.4 C)  TempSrc: Oral  SpO2: 98%  Weight: 223 lb 6.4 oz (101.3 kg)  Height: 5\' 4"  (1.626 m)    General: Well developed, well nourished, in no acute distress  Skin : Warm and dry.  Head: Normocephalic and atraumatic  Eyes: Sclera and conjunctiva clear; pupils round and reactive to light; extraocular movements intact  Ears: External normal; canals clear; tympanic membranes normal  Oropharynx: Pink, supple. No suspicious lesions  Neck: Supple without thyromegaly, adenopathy  Lungs: Respirations unlabored; clear to auscultation bilaterally without wheeze, rales, rhonchi  CVS exam: normal rate and regular rhythm.  Abdomen: Soft; nontender; nondistended; normoactive bowel sounds; no masses or hepatosplenomegaly  Musculoskeletal: No deformities; no active joint inflammation  Extremities: No edema, cyanosis, clubbing  Vessels: Symmetric bilaterally  Neurologic: Alert and oriented; speech intact; face symmetrical; moves all extremities well; CNII-XII intact without focal deficit   Assessment:  1. PE (physical exam), annual   2. Left knee pain, unspecified chronicity  Plan:  Age appropriate preventive healthcare needs addressed; encouraged regular eye doctor and dental exams; encouraged regular exercise and continue working on weight loss goals; she is not a candidate for oral OCPs but will reach out to her GYN if she decides she wants to try IUD; also discussed using Metformin more regularly especially to help with pre-menstrual eating;  follow-up to be determined; Will update left knee X -ray- will most likely need referral to sports medicine;   This visit occurred  during the SARS-CoV-2 public health emergency.  Safety protocols were in place, including screening questions prior to the visit, additional usage of staff PPE, and extensive cleaning of exam room while observing appropriate contact time as indicated for disinfecting solutions.    No follow-ups on file.  Orders Placed This Encounter  Procedures   DG Knee Complete 4 Views Left    Standing Status:   Future    Standing Expiration Date:   12/11/2022    Order Specific Question:   Reason for Exam (SYMPTOM  OR DIAGNOSIS REQUIRED)    Answer:   left knee pain    Order Specific Question:   Is patient pregnant?    Answer:   No    Order Specific Question:   Preferred imaging location?    Answer:   [facility]    Requested Prescriptions    No prescriptions requested or ordered in this encounter

## 2021-12-11 NOTE — Progress Notes (Unsigned)
?  Office: [phone]  /  Fax: [phone] ? ? ? ?Date: December 25, 2021   ?Appointment Start Time: *** ?Duration: *** minutes ?Provider: Lawerance Cruel, Psy.D. ?Type of Session: Individual Therapy  ?Location of Patient: {gbptloc:23249} ?Location of Provider: Provider's Home (private office) ?Type of Contact: Telepsychological Visit via MyChart Video Visit ? ?Session Content: Traci Long is a 31 y.o. female presenting for a follow-up appointment to address the previously established treatment goal of increasing coping skills.Today's appointment was a telepsychological visit due to COVID-19. Solangel provided verbal consent for today's telepsychological appointment and she is aware she is responsible for securing confidentiality on her end of the session. Prior to proceeding with today's appointment, Shakerria's physical location at the time of this appointment was obtained as well a phone number she could be reached at in the event of technical difficulties. [place] and this provider participated in today's telepsychological service.  ? ?This provider conducted a brief check-in. *** Sully was receptive to today's appointment as evidenced by openness to sharing, responsiveness to feedback, and {gbreceptiveness:23401}. ? ?Mental Status Examination:  ?Appearance: {Appearance:22431} ?Behavior: {Behavior:22445} ?Mood: {gbmood:21757} ?Affect: {Affect:22436} ?Speech: {Speech:22432} ?Eye Contact: {Eye Contact:22433} ?Psychomotor Activity: {Motor Activity:22434} ?Gait: {gbgait:23404} ?Thought Process: {thought process:22448}  ?Thought Content/Perception: {disturbances:22451} ?Orientation: {Orientation:22437} ?Memory/Concentration: {gbcognition:22449} ?Insight: {Insight:22446} ?Judgment: {Insight:22446} ? ?Interventions:  ?{Interventions for Progress Notes:23405} ? ?DSM-5 Diagnosis(es): F32.89 Other Specified Depressive Disorder, Emotional Eating Behaviors ? ?Treatment Goal & Progress: During the initial appointment with this provider, the  following treatment goal was established: increase coping skills. Daysha has demonstrated progress in her goal as evidenced by {gbtxprogress:22839}. Cherith also {gbtxprogress2:22951}. ? ?Plan: The next appointment will be scheduled in {gbweeks:21758}, which will be {gbtxmodality:23402}. The next session will focus on {Plan for Next Appointment:23400}. ? ?

## 2021-12-12 ENCOUNTER — Other Ambulatory Visit: Payer: Self-pay | Admitting: Family

## 2021-12-12 DIAGNOSIS — G8929 Other chronic pain: Secondary | ICD-10-CM

## 2021-12-25 ENCOUNTER — Encounter: Payer: Self-pay | Admitting: Family Medicine

## 2021-12-25 ENCOUNTER — Ambulatory Visit: Payer: Self-pay

## 2021-12-25 ENCOUNTER — Telehealth (INDEPENDENT_AMBULATORY_CARE_PROVIDER_SITE_OTHER): Payer: Managed Care, Other (non HMO) | Admitting: Psychology

## 2021-12-25 ENCOUNTER — Ambulatory Visit (INDEPENDENT_AMBULATORY_CARE_PROVIDER_SITE_OTHER): Payer: Managed Care, Other (non HMO) | Admitting: Family Medicine

## 2021-12-25 VITALS — BP 100/72 | Ht 64.0 in | Wt 223.0 lb

## 2021-12-25 DIAGNOSIS — Q7962 Hypermobile Ehlers-Danlos syndrome: Secondary | ICD-10-CM | POA: Diagnosis not present

## 2021-12-25 DIAGNOSIS — M25371 Other instability, right ankle: Secondary | ICD-10-CM | POA: Diagnosis not present

## 2021-12-25 DIAGNOSIS — M7042 Prepatellar bursitis, left knee: Secondary | ICD-10-CM | POA: Insufficient documentation

## 2021-12-25 DIAGNOSIS — M25562 Pain in left knee: Secondary | ICD-10-CM

## 2021-12-25 NOTE — Patient Instructions (Signed)
Nice to meet you ?Please try heat on the knee  ?Please try the exercises  ?Please try the compression  ?Please check out body braid  ?Please check out [email or web address]   ?Please send me a message in MyChart with any questions or updates.  ?Please see me back in 4-6 weeks.  ? ?--Dr. ? ?

## 2021-12-25 NOTE — Assessment & Plan Note (Signed)
Beighton score 8/9.  Has significant hypermobility within her hands and wrist as well as her knees and ankles. ?

## 2021-12-25 NOTE — Assessment & Plan Note (Signed)
Acute on chronic in nature.  Has a very mild bursitis present.  Her hypermobility is likely contributing. ?-Counseled on home exercise therapy and supportive care. ?-Counseled on compression. ?-Could consider physical therapy. ?

## 2021-12-25 NOTE — Progress Notes (Signed)
?  Traci Long - 31 y.o. female MRN [id number]  Date of birth: 1991-06-27 ? ?SUBJECTIVE:  Including CC & ROS.  ?No chief complaint on file. ? ? ?Traci Long is a 31 y.o. female that is presenting with acute on chronic left knee pain and right ankle pain.  She had a fall about a year ago where she landed on the left knee.  Her right ankle gave way periodically.  Has not performed any physical therapy. ? ?Reviewed the note from 2/21 shows she had an x-ray performed. ?Independent review of the left knee x-ray from 2/21 shows no acute changes. ? ? ?Review of Systems ?See HPI  ? ?HISTORY: Past Medical, Surgical, Social, and Family History Reviewed & Updated per EMR.   ?Pertinent Historical Findings include: ? ?Past Medical History:  ?Diagnosis Date  ? Anxiety   ? Back pain   ? Depression   ? Fatigue   ? Knee pain   ? Lactose intolerance   ? ? ?Past Surgical History:  ?Procedure Laterality Date  ? CESAREAN SECTION    ? ? ? ?PHYSICAL EXAM:  ?VS: BP 100/72 (BP Location: Left Arm, Patient Position: Sitting)   Ht 5\' 4"  (1.626 m)   Wt 223 lb (101.2 kg)   LMP 12/10/2021   BMI 38.28 kg/m?  ?Physical Exam ?Gen: NAD, alert, cooperative with exam, well-appearing ?MSK:  ?Neurovascularly intact   ? ?Limited ultrasound: Left knee, right ankle: ? ?Left knee: ?No effusion. ?Normal-appearing quadricep tendon. ?There is a thin anechoic change over the patellar tendon to suspect bursitis. ?Normal-appearing medial and lateral compartment. ? ?Right ankle: ?Normal-appearing peroneal tendons. ?No ankle effusion. ?No changes in the talus. ? ?Summary: Left knee with prepatellar bursitis. ? ?Ultrasound and interpretation by 12/12/2021, MD ? ? ? ?ASSESSMENT & PLAN:  ? ?Prepatellar bursitis of left knee ?Acute on chronic in nature.  Has a very mild bursitis present.  Her hypermobility is likely contributing. ?-Counseled on home exercise therapy and supportive care. ?-Counseled on compression. ?-Could consider physical  therapy. ? ?Ehlers-Danlos syndrome type III ?Beighton score 8/9.  Has significant hypermobility within her hands and wrist as well as her knees and ankles. ? ?Ankle instability, right ?Acute on chronic in nature.  Her hypermobility is likely contributing to her repetitive ankle rolling. ?-Counseled on home exercise therapy and supportive care. ?-Counseled on compression. ?-Could consider physical therapy or custom orthotics. ? ? ? ? ?

## 2021-12-25 NOTE — Assessment & Plan Note (Signed)
Acute on chronic in nature.  Her hypermobility is likely contributing to her repetitive ankle rolling. ?-Counseled on home exercise therapy and supportive care. ?-Counseled on compression. ?-Could consider physical therapy or custom orthotics. ?

## 2021-12-31 NOTE — Progress Notes (Unsigned)
°  Office: [phone]  /  Fax: [phone]    Date: 01/14/2022   Appointment Start Time: *** Duration: *** minutes Provider: Lawerance Cruel, Psy.D. Type of Session: Individual Therapy  Location of Patient: {gbptloc:23249} Location of Provider: Provider's Home (private office) Type of Contact: Telepsychological Visit via MyChart Video Visit  Session Content: Traci Long is a 31 y.o. female presenting for a follow-up appointment to address the previously established treatment goal of increasing coping skills.Today's appointment was a telepsychological visit due to COVID-19. Traci Long provided verbal consent for today's telepsychological appointment and she is aware she is responsible for securing confidentiality on her end of the session. Prior to proceeding with today's appointment, Traci Long's physical location at the time of this appointment was obtained as well a phone number she could be reached at in the event of technical difficulties. Traci Long and this provider participated in today's telepsychological service.   This provider conducted a brief check-in. *** Traci Long was receptive to today's appointment as evidenced by openness to sharing, responsiveness to feedback, and {gbreceptiveness:23401}.  Mental Status Examination:  Appearance: {Appearance:22431} Behavior: {Behavior:22445} Mood: {gbmood:21757} Affect: {Affect:22436} Speech: {Speech:22432} Eye Contact: {Eye Contact:22433} Psychomotor Activity: {Motor Activity:22434} Gait: {gbgait:23404} Thought Process: {thought process:22448}  Thought Content/Perception: {disturbances:22451} Orientation: {Orientation:22437} Memory/Concentration: {gbcognition:22449} Insight: {Insight:22446} Judgment: {Insight:22446}  Interventions:  {Interventions for Progress Notes:23405}  DSM-5 Diagnosis(es): F32.89 Other Specified Depressive Disorder, Emotional Eating Behaviors  Treatment Goal & Progress: During the initial appointment with this provider, the  following treatment goal was established: increase coping skills. Traci Long has demonstrated progress in her goal as evidenced by {gbtxprogress:22839}. Traci Long also {gbtxprogress2:22951}.  Plan: The next appointment will be scheduled in {gbweeks:21758}, which will be {gbtxmodality:23402}. The next session will focus on {Plan for Next Appointment:23400}.

## 2022-01-14 ENCOUNTER — Telehealth (INDEPENDENT_AMBULATORY_CARE_PROVIDER_SITE_OTHER): Payer: 59 | Admitting: Psychology

## 2022-01-14 DIAGNOSIS — F3289 Other specified depressive episodes: Secondary | ICD-10-CM

## 2022-01-14 NOTE — Progress Notes (Signed)
?Office: [phone]  /  Fax: [phone] ? ? ? ?Date: 01/14/2022   ?Appointment Start Time: 10:00am ?Duration: 35 minutes ?Provider: Glennie Long, Psy.D. ?Type of Session: Individual Therapy  ?Location of Patient: Home (private location) ?Location of Provider: Provider's Home (private office) ?Type of Contact: Telepsychological Visit via MyChart Video Visit ? ?Session Content: Traci Long is a 31 y.o. female presenting for a follow-up appointment to address the previously established treatment goal of increasing coping skills.Today's appointment was a telepsychological visit due to COVID-19. Traci Long provided verbal consent for today's telepsychological appointment and she is aware she is responsible for securing confidentiality on her end of the session. Prior to proceeding with today's appointment, Traci Long's physical location at the time of this appointment was obtained as well a phone number she could be reached at in the event of technical difficulties. Traci Long and this provider participated in today's telepsychological service. Of note, today's appointment was switched to a regular telephone call at 10:13am with Traci Long's verbal consent due to technical issues.  ? ?This provider conducted a brief check-in. Traci Long shared about recent events, noting she is continuing to meet with her primary therapist. She reported there are no concerns related to her eating habits, but discussed challenges with motivation to leave the house due to increased responsibilities and secondary fatigue. Notably, she stated she is discussing the aforementioned with her primary therapist. A risk assessment was conducted. Traci Long disclosed experiencing the thought, "It would be fine if I get sick enough and die." She explained it occurred over the weekend "on and off." She denied experiencing suicidal plan and intent. She denied currently experiencing suicidal ideation, plan and intent (assessed during check-in part of today's appointment and again at  the end of the appointment). Traci Long reported a plan to discuss further with her primary therapist during their next appointment (02/05/2022), but also noted a plan to reach out to her primary therapist today. She continues to acknowledge understanding regarding the importance of reaching out to trusted individuals and/or emergency resources if she is unable to ensure safety.  ? ?Despite improvements in her eating habits, Traci Long reported she continues to have some challenges with cravings. As such, Traci Long was engaged in problem solving to develop a plan to help cope with urges/cravings involving activities to relax, activities to distract, comforting places, people to call and connect with, and activities that help soothe senses. This provider and Traci Long also dicussed utilization of the plan for when she is feeling overwhelmed. Overall, Traci Long was receptive to today's appointment as evidenced by openness to sharing, responsiveness to feedback, and willingness to implement discussed strategies . ? ?Mental Status Examination:  ?Appearance: neat ?Behavior: appropriate to circumstances ?Mood: sad ?Affect: mood congruent ?Speech: WNL ?Eye Contact: appropriate ?Psychomotor Activity: WNL ?Gait: unable to assess ?Thought Process: linear, logical, and goal directed and denies suicidal, homicidal, and self-harm ideation, plan and intent  ?Thought Content/Perception: no hallucinations, delusions, bizarre thinking or behavior endorsed or observed ?Orientation: AAOx4 ?Memory/Concentration: memory, attention, language, and fund of knowledge intact  ?Insight: fair ?Judgment: fair ? ?Interventions:  ?Conducted a brief chart review ?Conducted a risk assessment ?Provided empathic reflections and validation ?Employed supportive psychotherapy interventions to facilitate reduced distress and to improve coping skills with identified stressors ?Engaged patient in problem solving ? ?DSM-5 Diagnosis(es): F32.89 Other Specified Depressive Disorder,  Emotional Eating Behaviors ? ?Treatment Goal & Progress: During the initial appointment with this provider, the following treatment goal was established: increase coping skills. Traci Long has demonstrated progress in her goal as evidenced  by increased awareness of hunger patterns, increased awareness of triggers for emotional eating behaviors, and reduction in emotional eating behaviors . Traci Long also continues to demonstrate willingness to engage in learned skill(s). ? ?Plan: Traci Long declined future appointments with this provider. Traci Long will continue meeting with her primary therapist. She acknowledged understanding that she may request a follow-up appointment with this provider in the future as long as she is still established with the clinic. No further follow-up planned by this provider.   ? ?

## 2022-01-17 ENCOUNTER — Ambulatory Visit (INDEPENDENT_AMBULATORY_CARE_PROVIDER_SITE_OTHER): Payer: Managed Care, Other (non HMO) | Admitting: Family Medicine

## 2022-01-17 ENCOUNTER — Encounter (INDEPENDENT_AMBULATORY_CARE_PROVIDER_SITE_OTHER): Payer: Self-pay | Admitting: Family Medicine

## 2022-01-17 VITALS — BP 103/70 | HR 70 | Temp 98.1°F | Ht 64.0 in | Wt 219.0 lb

## 2022-01-17 DIAGNOSIS — E669 Obesity, unspecified: Secondary | ICD-10-CM | POA: Diagnosis not present

## 2022-01-17 DIAGNOSIS — Z9189 Other specified personal risk factors, not elsewhere classified: Secondary | ICD-10-CM | POA: Diagnosis not present

## 2022-01-17 DIAGNOSIS — E559 Vitamin D deficiency, unspecified: Secondary | ICD-10-CM

## 2022-01-17 DIAGNOSIS — E8881 Metabolic syndrome: Secondary | ICD-10-CM

## 2022-01-17 DIAGNOSIS — Z6837 Body mass index (BMI) 37.0-37.9, adult: Secondary | ICD-10-CM

## 2022-01-17 MED ORDER — METFORMIN HCL 500 MG PO TABS: ORAL_TABLET | ORAL | 0 refills | Status: DC

## 2022-01-17 MED ORDER — VITAMIN D (ERGOCALCIFEROL) 1.25 MG (50000 UNIT) PO CAPS: 50000.0000 [IU] | ORAL_CAPSULE | ORAL | 0 refills | Status: DC

## 2022-01-23 NOTE — Progress Notes (Signed)
? ? ? ?Chief Complaint:  ? ?OBESITY ?Traci Long is here to discuss her progress with her obesity treatment plan along with follow-up of her obesity related diagnoses. Traci Long is on practicing portion control and making smarter food choices, such as increasing vegetables and decreasing simple carbohydrates and states she is following her eating plan approximately 40% of the time. Traci Long states she is not currently exercising. ? ?Today's visit was #: 8 ?Starting weight: 217 lbs ?Starting date: 06/29/2021 ?Today's weight: 219 lbs ?Today's date: 01/17/2022 ?Total lbs lost to date: 0 ?Total lbs lost since last in-office visit: 0 ? ?Interim History: Traci Long following the plan about 40%. She is still dealing with emotional eating and she has been working on being more mindful of her eating practices. She is seeing Traci Long for counseling for mood and emotional eating going well.  ? ?Subjective:  ? ?1. Insulin resistance ?Traci Long is taking metformin, and she denies side effects. Traci Long is tolerating medication(s) well without side effects. Medication compliance is good as patient endorses taking it as prescribed. The patient denies additional concerns regarding this condition.     ? ?2. Vitamin D deficiency ?She is currently taking prescription vitamin D 50,000 IU each week. She denies nausea, vomiting or muscle weakness. ? ?3. At risk for malnutrition ?Traci Long is at increased risk for malnutrition due to deficiencies with certain foods. ? ?Assessment/Plan:  ?No orders of the defined types were placed in this encounter. ? ? ?Medications Discontinued During This Encounter  ?Medication Reason  ? metFORMIN (GLUCOPHAGE) 500 MG tablet Reorder  ? Vitamin D, Ergocalciferol, (DRISDOL) 1.25 MG (50000 UNIT) CAPS capsule Reorder  ?  ? ?Meds ordered this encounter  ?Medications  ? Vitamin D, Ergocalciferol, (DRISDOL) 1.25 MG (50000 UNIT) CAPS capsule  ?  Sig: Take 1 capsule (50,000 Units total) by mouth every 7 (seven) days.  ?  Dispense:   4 capsule  ?  Refill:  0  ?  30 d supply;  ** OV for RF **   Do not send RF request  ? metFORMIN (GLUCOPHAGE) 500 MG tablet  ?  Sig: 1 PO WITH LUNCH DAILY  ?  Dispense:  30 tablet  ?  Refill:  0  ?  ? ?1. Insulin resistance ?We will refill metformin for 1 month. Traci Long will continue to work on weight loss, exercise, and decreasing simple carbohydrates to help decrease the risk of diabetes. Traci Long agreed to follow-up with [place] as directed to closely monitor her progress. ? ?- metFORMIN (GLUCOPHAGE) 500 MG tablet; 1 PO WITH LUNCH DAILY  Dispense: 30 tablet; Refill: 0 ? ?2. Vitamin D deficiency ?We will refill prescription Vitamin D for 1 month. Traci Long will follow-up for routine testing of Vitamin D, at least 2-3 times per year to avoid over-replacement. ? ?- Vitamin D, Ergocalciferol, (DRISDOL) 1.25 MG (50000 UNIT) CAPS capsule; Take 1 capsule (50,000 Units total) by mouth every 7 (seven) days.  Dispense: 4 capsule; Refill: 0 ? ?3. At risk for malnutrition ?Traci Long was given approximately 9 minutes of counseling today regarding prevention of malnutrition and ways to meet macronutrient goals..  ? ?4. Obesity with current BMI of 37.7 ?Traci Long is currently in the action stage of change. As such, her goal is to continue with weight loss efforts. She has agreed to practicing portion control and making smarter food choices, such as increasing vegetables and decreasing simple carbohydrates.  ? ?Told the patient she needs blood work in the near future with [place] or her PCP. ? ?  Exercise goals: All adults should avoid inactivity. Some physical activity is better than none, and adults who participate in any amount of physical activity gain some health benefits. Would help with emotional eating. ? ?Behavioral modification strategies: increasing lean protein intake, decreasing simple carbohydrates, avoiding temptations, and planning for success. ? ?Traci Long has agreed to follow-up with our clinic in 3 weeks. She was informed of the importance  of frequent follow-up visits to maximize her success with intensive lifestyle modifications for her multiple health conditions.  ? ?Objective:  ? ?Blood pressure 103/70, pulse 70, temperature 98.1 ?F (36.7 ?C), height 5\' 4"  (1.626 m), weight 219 lb (99.3 kg), SpO2 98 %. ?Body mass index is 37.59 kg/m?. ? ?General: Cooperative, alert, well developed, in no acute distress. ?HEENT: Conjunctivae and lids unremarkable. ?Cardiovascular: Regular rhythm.  ?Lungs: Normal work of breathing. ?Neurologic: No focal deficits.  ? ?Lab Results  ?Component Value Date  ? CREATININE 0.76 07/02/2021  ? BUN 10 07/02/2021  ? NA 139 07/02/2021  ? K 4.3 07/02/2021  ? CL 104 07/02/2021  ? CO2 20 07/02/2021  ? ?Lab Results  ?Component Value Date  ? ALT 15 07/02/2021  ? AST 18 07/02/2021  ? ALKPHOS 98 07/02/2021  ? BILITOT 0.2 07/02/2021  ? ?Lab Results  ?Component Value Date  ? HGBA1C 5.2 07/02/2021  ? ?Lab Results  ?Component Value Date  ? INSULIN 8.9 07/02/2021  ? ?Lab Results  ?Component Value Date  ? TSH 2.820 07/02/2021  ? ?Lab Results  ?Component Value Date  ? CHOL 167 07/02/2021  ? HDL 46 07/02/2021  ? LDLCALC 100 (H) 07/02/2021  ? TRIG 114 07/02/2021  ? CHOLHDL 3 03/27/2021  ? ?Lab Results  ?Component Value Date  ? VD25OH 21.9 (L) 07/02/2021  ? ?Lab Results  ?Component Value Date  ? WBC 7.7 07/02/2021  ? HGB 14.5 07/02/2021  ? HCT 46.8 (H) 07/02/2021  ? MCV 97 07/02/2021  ? PLT 365 07/02/2021  ? ?No results found for: IRON, TIBC, FERRITIN ? ?Attestation Statements:  ? ?Reviewed by clinician on day of visit: allergies, medications, problem list, medical history, surgical history, family history, social history, and previous encounter notes. ? ? ?I, 09/01/2021, am acting as transcriptionist for Burt Knack, DO. ? ?I have reviewed the above documentation for accuracy and completeness, and I agree with the above. [employer], D.O. ? ?The 21st Century Cures Act was signed into law in 2016 which includes the topic of  electronic health records.  This provides immediate access to information in MyChart.  This includes consultation notes, operative notes, office notes, lab results and pathology reports.  If you have any questions about what you read please let 2017 know at your next visit so we can discuss your concerns and take corrective action if need be.  We are right here with you. ? ? ?

## 2022-02-07 ENCOUNTER — Encounter: Payer: Self-pay | Admitting: Family Medicine

## 2022-02-07 ENCOUNTER — Ambulatory Visit (INDEPENDENT_AMBULATORY_CARE_PROVIDER_SITE_OTHER): Payer: Managed Care, Other (non HMO) | Admitting: Family Medicine

## 2022-02-07 DIAGNOSIS — M25371 Other instability, right ankle: Secondary | ICD-10-CM

## 2022-02-07 DIAGNOSIS — M7042 Prepatellar bursitis, left knee: Secondary | ICD-10-CM | POA: Diagnosis not present

## 2022-02-07 NOTE — Progress Notes (Signed)
?  Traci Long - 31 y.o. female MRN [id number]  Date of birth: 03/07/1991 ? ?SUBJECTIVE:  Including CC & ROS.  ?No chief complaint on file. ? ? ?Traci Long is a 31 y.o. female that is following up for her knee pain and presenting with acute on chronic bilateral ankle pain.  She has been doing well in regards to her knee.  She still gets ankle pain intermittently.  She has a history of chronic ankle instability. ? ? ?Review of Systems ?See HPI  ? ?HISTORY: Past Medical, Surgical, Social, and Family History Reviewed & Updated per EMR.   ?Pertinent Historical Findings include: ? ?Past Medical History:  ?Diagnosis Date  ? Anxiety   ? Back pain   ? Depression   ? Fatigue   ? Knee pain   ? Lactose intolerance   ? ? ?Past Surgical History:  ?Procedure Laterality Date  ? CESAREAN SECTION    ? ? ? ?PHYSICAL EXAM:  ?VS: BP 104/70 (BP Location: Left Arm, Patient Position: Sitting)   Ht 5\' 4"  (1.626 m)   Wt 219 lb (99.3 kg)   BMI 37.59 kg/m?  ?Physical Exam ?Gen: NAD, alert, cooperative with exam, well-appearing ?MSK:  ?Neurovascularly intact   ? ? ? ? ?ASSESSMENT & PLAN:  ? ?Ankle instability, right ?Acute on chronic in nature.  Does have instability with the left side as well. ?-Counseled on home exercise therapy and supportive care. ?-Could consider physical therapy orthotics. ? ?Prepatellar bursitis of left knee ?Doing well with examining the strengthening and recognizing her posture. ?-Counseled on home exercise therapy and supportive care. ?-Could consider physical therapy or further imaging. ? ? ? ? ?

## 2022-02-07 NOTE — Assessment & Plan Note (Signed)
Acute on chronic in nature.  Does have instability with the left side as well. ?-Counseled on home exercise therapy and supportive care. ?-Could consider physical therapy orthotics. ?

## 2022-02-07 NOTE — Assessment & Plan Note (Signed)
Doing well with examining the strengthening and recognizing her posture. ?-Counseled on home exercise therapy and supportive care. ?-Could consider physical therapy or further imaging. ?

## 2022-02-11 ENCOUNTER — Ambulatory Visit (INDEPENDENT_AMBULATORY_CARE_PROVIDER_SITE_OTHER): Payer: Managed Care, Other (non HMO) | Admitting: Family Medicine

## 2022-02-11 ENCOUNTER — Encounter (INDEPENDENT_AMBULATORY_CARE_PROVIDER_SITE_OTHER): Payer: Self-pay | Admitting: Family Medicine

## 2022-02-11 VITALS — BP 105/73 | HR 77 | Temp 98.0°F | Ht 64.0 in | Wt 223.0 lb

## 2022-02-11 DIAGNOSIS — E559 Vitamin D deficiency, unspecified: Secondary | ICD-10-CM

## 2022-02-11 DIAGNOSIS — E8881 Metabolic syndrome: Secondary | ICD-10-CM | POA: Diagnosis not present

## 2022-02-11 DIAGNOSIS — E669 Obesity, unspecified: Secondary | ICD-10-CM

## 2022-02-11 DIAGNOSIS — Z6838 Body mass index (BMI) 38.0-38.9, adult: Secondary | ICD-10-CM

## 2022-02-11 DIAGNOSIS — Z9189 Other specified personal risk factors, not elsewhere classified: Secondary | ICD-10-CM

## 2022-02-11 DIAGNOSIS — E88819 Insulin resistance, unspecified: Secondary | ICD-10-CM

## 2022-02-11 MED ORDER — METFORMIN HCL 500 MG PO TABS: ORAL_TABLET | ORAL | 0 refills | Status: DC

## 2022-02-11 MED ORDER — VITAMIN D (ERGOCALCIFEROL) 1.25 MG (50000 UNIT) PO CAPS: 50000.0000 [IU] | ORAL_CAPSULE | ORAL | 0 refills | Status: DC

## 2022-02-21 NOTE — Progress Notes (Signed)
? ? ? ?Chief Complaint:  ? ?OBESITY ?Palak is here to discuss her progress with her obesity treatment plan along with follow-up of her obesity related diagnoses. Jeidi is on portion control and smart choices and states she is following her eating plan approximately 70% of the time. Tylia states she is walking 45 minutes 2 times per week. ? ?Today's visit was #: 9 ?Starting weight: 217 lbs ?Starting date: 06/29/2021 ?Today's weight: 223 lbs ?Today's date: 02/11/2022 ?Total lbs lost to date: 0 ?Total lbs lost since last in-office visit: 0 ? ?Interim History: Dominik states that she had a bad weekend last weekend. She drank and ate with girlfriends and gained weight. Tamiah is still working with her counselor Weston Settle on sleep and dealing with life's stressors. Her child is not sleeping well. ? ?Subjective:  ? ?1. Vitamin D deficiency ?Jaeline just got back on Ergocalciferol regularly for 1 month now. ? ?2. Insulin resistance ?Virgilia K Leonetti is tolerating medication(s) well without side effects. Medication compliance is good as patient endorses taking it as prescribed.  The patient denies additional concerns regarding this condition.     ? ?3. At risk for depression ?Silvana is at risk for depression due to increased sleep issues and stressors. ? ?Assessment/Plan:  ?No orders of the defined types were placed in this encounter. ? ? ?Medications Discontinued During This Encounter  ?Medication Reason  ? Vitamin D, Ergocalciferol, (DRISDOL) 1.25 MG (50000 UNIT) CAPS capsule Reorder  ? metFORMIN (GLUCOPHAGE) 500 MG tablet Reorder  ?  ? ?Meds ordered this encounter  ?Medications  ? Vitamin D, Ergocalciferol, (DRISDOL) 1.25 MG (50000 UNIT) CAPS capsule  ?  Sig: Take 1 capsule (50,000 Units total) by mouth every 7 (seven) days.  ?  Dispense:  4 capsule  ?  Refill:  0  ?  30 d supply;  ** OV for RF **   Do not send RF request  ? metFORMIN (GLUCOPHAGE) 500 MG tablet  ?  Sig: 1 PO WITH LUNCH DAILY  ?  Dispense:  30 tablet  ?  Refill:   0  ?  ? ?1. Vitamin D deficiency ?Low Vitamin D level contributes to fatigue and are associated with obesity, breast, and colon cancer. She agrees to continue to take prescription Vitamin D @50 ,000 IU every week and will follow-up for routine testing of Vitamin D, at least 2-3 times per year to avoid over-replacement. ? ?- Vitamin D, Ergocalciferol, (DRISDOL) 1.25 MG (50000 UNIT) CAPS capsule; Take 1 capsule (50,000 Units total) by mouth every 7 (seven) days.  Dispense: 4 capsule; Refill: 0 ? ?2. Insulin resistance ?Shawnetta will continue to work on weight loss, exercise, and decreasing simple carbohydrates to help decrease the risk of diabetes. Kameka agreed to continue taking metformin and will follow-up with as directed to closely monitor her progress. ? ?- metFORMIN (GLUCOPHAGE) 500 MG tablet; 1 PO WITH LUNCH DAILY  Dispense: 30 tablet; Refill: 0 ? ?3. At risk for depression ?Zykerria was given approximately 9 minutes of depression risk counseling today. She has risk factors for depression including sleep issues and stress. We discussed the importance of a healthy work life balance, a healthy relationship with food and a good support system. ? ?Repetitive spaced learning was employed today to elicit superior memory formation and behavioral change.  ? ?4. Obesity, current BMI 38.3 ?Adel is currently in the action stage of change. As such, her goal is to continue with weight loss efforts. She has agreed to keeping a  food journal and adhering to recommended goals of 1200 to 1300 calories and 90+ grams of protein.  ? ?Exercise goals:  As is. ? ?Behavioral modification strategies: avoiding temptations and planning for success. ? ?Addalynne has agreed to follow-up with our clinic in 3 weeks. She was informed of the importance of frequent follow-up visits to maximize her success with intensive lifestyle modifications for her multiple health conditions.  ? ?Objective:  ? ?Blood pressure 105/73, pulse 77, temperature 98 ?F  (36.7 ?C), height 5\' 4"  (1.626 m), weight 223 lb (101.2 kg), SpO2 98 %. ?Body mass index is 38.28 kg/m?. ? ?General: Cooperative, alert, well developed, in no acute distress. ?HEENT: Conjunctivae and lids unremarkable. ?Cardiovascular: Regular rhythm.  ?Lungs: Normal work of breathing. ?Neurologic: No focal deficits.  ? ?Lab Results  ?Component Value Date  ? CREATININE 0.76 07/02/2021  ? BUN 10 07/02/2021  ? NA 139 07/02/2021  ? K 4.3 07/02/2021  ? CL 104 07/02/2021  ? CO2 20 07/02/2021  ? ?Lab Results  ?Component Value Date  ? ALT 15 07/02/2021  ? AST 18 07/02/2021  ? ALKPHOS 98 07/02/2021  ? BILITOT 0.2 07/02/2021  ? ?Lab Results  ?Component Value Date  ? HGBA1C 5.2 07/02/2021  ? ?Lab Results  ?Component Value Date  ? INSULIN 8.9 07/02/2021  ? ?Lab Results  ?Component Value Date  ? TSH 2.820 07/02/2021  ? ?Lab Results  ?Component Value Date  ? CHOL 167 07/02/2021  ? HDL 46 07/02/2021  ? LDLCALC 100 (H) 07/02/2021  ? TRIG 114 07/02/2021  ? CHOLHDL 3 03/27/2021  ? ?Lab Results  ?Component Value Date  ? VD25OH 21.9 (L) 07/02/2021  ? ?Lab Results  ?Component Value Date  ? WBC 7.7 07/02/2021  ? HGB 14.5 07/02/2021  ? HCT 46.8 (H) 07/02/2021  ? MCV 97 07/02/2021  ? PLT 365 07/02/2021  ? ?No results found for: IRON, TIBC, FERRITIN ? ?Attestation Statements:  ? ?Reviewed by clinician on day of visit: allergies, medications, problem list, medical history, surgical history, family history, social history, and previous encounter notes. ? ?I, 09/01/2021, CMA, am acting as transcriptionist for Kirke Corin, DO ? ?I have reviewed the above documentation for accuracy and completeness, and I agree with the above. [employer], D.O. ? ?The 21st Century Cures Act was signed into law in 2016 which includes the topic of electronic health records.  This provides immediate access to information in MyChart.  This includes consultation notes, operative notes, office notes, lab results and pathology reports.  If you have any  questions about what you read please let 2017 know at your next visit so we can discuss your concerns and take corrective action if need be.  We are right here with you. ? ?

## 2022-03-12 ENCOUNTER — Ambulatory Visit (INDEPENDENT_AMBULATORY_CARE_PROVIDER_SITE_OTHER): Payer: Managed Care, Other (non HMO) | Admitting: Family Medicine

## 2022-03-25 ENCOUNTER — Encounter (INDEPENDENT_AMBULATORY_CARE_PROVIDER_SITE_OTHER): Payer: Self-pay | Admitting: Family Medicine

## 2022-03-25 ENCOUNTER — Telehealth (INDEPENDENT_AMBULATORY_CARE_PROVIDER_SITE_OTHER): Payer: Managed Care, Other (non HMO) | Admitting: Family Medicine

## 2022-03-25 DIAGNOSIS — Z6837 Body mass index (BMI) 37.0-37.9, adult: Secondary | ICD-10-CM

## 2022-03-25 DIAGNOSIS — Z7984 Long term (current) use of oral hypoglycemic drugs: Secondary | ICD-10-CM

## 2022-03-25 DIAGNOSIS — F39 Unspecified mood [affective] disorder: Secondary | ICD-10-CM

## 2022-03-25 DIAGNOSIS — E669 Obesity, unspecified: Secondary | ICD-10-CM | POA: Diagnosis not present

## 2022-03-25 DIAGNOSIS — R7302 Impaired glucose tolerance (oral): Secondary | ICD-10-CM

## 2022-03-25 DIAGNOSIS — Z9189 Other specified personal risk factors, not elsewhere classified: Secondary | ICD-10-CM

## 2022-03-25 DIAGNOSIS — E559 Vitamin D deficiency, unspecified: Secondary | ICD-10-CM | POA: Diagnosis not present

## 2022-03-25 DIAGNOSIS — Z6838 Body mass index (BMI) 38.0-38.9, adult: Secondary | ICD-10-CM

## 2022-03-25 MED ORDER — METFORMIN HCL 500 MG PO TABS: ORAL_TABLET | ORAL | 0 refills | Status: AC

## 2022-03-25 MED ORDER — VITAMIN D (ERGOCALCIFEROL) 1.25 MG (50000 UNIT) PO CAPS: 50000.0000 [IU] | ORAL_CAPSULE | ORAL | 0 refills | Status: AC

## 2022-03-30 NOTE — Progress Notes (Signed)
TeleHealth Visit:  Due to the COVID-19 pandemic, this visit was completed with telemedicine (audio/video) technology to reduce patient and provider exposure as well as to preserve personal protective equipment.   Traci Long has verbally consented to this TeleHealth visit. The patient is located at home, the provider is located at the [facility] office. The participants in this visit include the listed provider and patient. The visit was conducted today via video.  Chief Complaint: OBESITY Traci Long is here to discuss her progress with her obesity treatment plan along with follow-up of her obesity related diagnoses. Traci Long is on keeping a food journal and adhering to recommended goals of 1200-1300 calories and 90+ grams protein and states she is following her eating plan approximately 40% of the time. Traci Long states she is walking 60 minutes 3 times per week.  Today's visit was #: 10 Starting weight: 217 lbs Starting date: 06/29/2021  Interim History: Traci Long reports having a cough, runny nose, headache, fever, chills, and body aches. Her temperature was 101.0 yesterday and symptoms started 2 days ago. She was on vacation recently and thinks that is where she got sick. She was not journaling during that time.   Subjective:   1. Mood disorder (HCC), emotional eating Traci Long is working with Efraim Kaufmann at [facility]. Melissa sent pt to see a psychiatrist for ADD evaluation.  2. Glucose intolerance (impaired glucose tolerance) Traci Long is tolerating medication(s) well without side effects. Medication compliance is good as patient endorses taking it as prescribed. The patient denies additional concerns regarding this condition.      3. Vitamin D deficiency She is currently taking prescription vitamin D 50,000 IU each week consistently. She denies nausea, vomiting or muscle weakness.  4. At increased risk of exposure to COVID-19 virus Traci Long is at increased risk of exposure to  COVID-19 virus due to recent vacation. Pt will go through [facility] for drive through testing.  Assessment/Plan:  No orders of the defined types were placed in this encounter.   Medications Discontinued During This Encounter  Medication Reason   Vitamin D, Ergocalciferol, (DRISDOL) 1.25 MG (50000 UNIT) CAPS capsule Reorder   metFORMIN (GLUCOPHAGE) 500 MG tablet Reorder     Meds ordered this encounter  Medications   metFORMIN (GLUCOPHAGE) 500 MG tablet    Sig: 1 PO WITH LUNCH DAILY    Dispense:  30 tablet    Refill:  0   Vitamin D, Ergocalciferol, (DRISDOL) 1.25 MG (50000 UNIT) CAPS capsule    Sig: Take 1 capsule (50,000 Units total) by mouth every 7 (seven) days.    Dispense:  4 capsule    Refill:  0    30 d supply;  ** OV for RF **   Do not send RF request     1. Mood disorder (HCC), emotional eating Behavior modification techniques were discussed today to help Traci Long deal with her emotional/non-hunger eating behaviors.  Orders and follow up as documented in patient record.  Pt was told that her current meds of Metformin and Ergocalciferol can be taken along with ADD meds, if psychiatrist opts to put her on some. All questions answered and counseling provided. Continue with Melissa every 2 weeks as is.  2. Glucose intolerance (impaired glucose tolerance) Continue current treatment plan.  Refill- metFORMIN (GLUCOPHAGE) 500 MG tablet; 1 PO WITH LUNCH DAILY  Dispense: 30 tablet; Refill: 0  3. Vitamin D deficiency Low Vitamin D level contributes to fatigue and are associated with obesity, breast, and  colon cancer. She agrees to continue to take prescription Vitamin D @50 ,000 IU every week and will follow-up for routine testing of Vitamin D, at least 2-3 times per year to avoid over-replacement. Pt reminded that Vitamin D also helps support her immune system.  Refill- Vitamin D, Ergocalciferol, (DRISDOL) 1.25 MG (50000 UNIT) CAPS capsule; Take 1 capsule (50,000 Units total) by mouth every 7  (seven) days.  Dispense: 4 capsule; Refill: 0  4. At increased risk of exposure to COVID-19 virus Traci Long was given approximately 10 minutes of COVID prevention counseling today Counseling COVID-19 is a respiratory infection that is caused by a virus. It can cause serious infections, such as pneumonia, acute respiratory distress syndrome, acute respiratory failure, or sepsis. You are more likely to develop a serious illness if you are 29 years of age or older, have a weak immune system, live in a nursing home, have chronic disease, or have obesity. Get vaccinated as soon as they are available to you.  For our most current information, please visit 44. Wash your hands often with soap and water for 20 seconds. If soap and water are not available, use alcohol-based hand sanitizer. Wear a face mask. Make sure your mask covers your nose and mouth. Maintain at least 6 feet distance from others when in public.  Get help right away if You have trouble breathing, chest pain, confusion, or other concerning symptoms.  Repetitive spaced learning was employed today to elicit superior memory formation and behavioral change.  5. Obesity, current BMI 38.2 Traci Long is currently in the action stage of change. As such, her goal is to continue with weight loss efforts. She has agreed to keeping a food journal and adhering to recommended goals of 1200-1300 calories and 90+ grams protein.   Pt will for evaluation of symptoms.  Exercise goals:  As is  Behavioral modification strategies: increasing lean protein intake, increasing water intake, and planning for success.  Traci Long has agreed to follow-up with our clinic in 3-4 weeks. She was informed of the importance of frequent follow-up visits to maximize her success with intensive lifestyle modifications for her multiple health conditions.  Objective:   VITALS: Per patient if applicable, see vitals. GENERAL: Alert and in no acute  distress. CARDIOPULMONARY: No increased WOB. Speaking in clear sentences.  PSYCH: Pleasant and cooperative. Speech normal rate and rhythm. Affect is appropriate. Insight and judgement are appropriate. Attention is focused, linear, and appropriate.  NEURO: Oriented as arrived to appointment on time with no prompting.   Lab Results  Component Value Date   CREATININE 0.76 07/02/2021   BUN 10 07/02/2021   NA 139 07/02/2021   K 4.3 07/02/2021   CL 104 07/02/2021   CO2 20 07/02/2021   Lab Results  Component Value Date   ALT 15 07/02/2021   AST 18 07/02/2021   ALKPHOS 98 07/02/2021   BILITOT 0.2 07/02/2021   Lab Results  Component Value Date   HGBA1C 5.2 07/02/2021   Lab Results  Component Value Date   INSULIN 8.9 07/02/2021   Lab Results  Component Value Date   TSH 2.820 07/02/2021   Lab Results  Component Value Date   CHOL 167 07/02/2021   HDL 46 07/02/2021   LDLCALC 100 (H) 07/02/2021   TRIG 114 07/02/2021   CHOLHDL 3 03/27/2021   Lab Results  Component Value Date   VD25OH 21.9 (L) 07/02/2021   Lab Results  Component Value Date   WBC 7.7 07/02/2021   HGB 14.5 07/02/2021  HCT 46.8 (H) 07/02/2021   MCV 97 07/02/2021   PLT 365 07/02/2021   No results found for: "IRON", "TIBC", "FERRITIN"  Attestation Statements:   Reviewed by clinician on day of visit: allergies, medications, problem list, medical history, surgical history, family history, social history, and previous encounter notes.  I, Kyung Ruddamesha Frazier, BS, CMA, am acting as transcriptionist for Marsh & McLennanDeborah Tranice Laduke, DO.  I have reviewed the above documentation for accuracy and completeness, and I agree with the above. Carlye Grippe- Tacha Manni J Fadil Macmaster, D.O.  The 21st Century Cures Act was signed into law in 2016 which includes the topic of electronic health records.  This provides immediate access to information in MyChart.  This includes consultation notes, operative notes, office notes, lab results and pathology reports.   If you have any questions about what you read please let us know at your next visit so we can discuss your concerns and take corrective action if need be.  We are right here with you.

## 2022-04-30 ENCOUNTER — Other Ambulatory Visit (INDEPENDENT_AMBULATORY_CARE_PROVIDER_SITE_OTHER): Payer: Self-pay | Admitting: Family Medicine

## 2022-04-30 DIAGNOSIS — R7302 Impaired glucose tolerance (oral): Secondary | ICD-10-CM

## 2022-05-09 ENCOUNTER — Encounter: Payer: Self-pay | Admitting: General Practice

## 2022-05-29 ENCOUNTER — Encounter (INDEPENDENT_AMBULATORY_CARE_PROVIDER_SITE_OTHER): Payer: Self-pay

## 2022-08-11 ENCOUNTER — Encounter (HOSPITAL_BASED_OUTPATIENT_CLINIC_OR_DEPARTMENT_OTHER): Payer: Self-pay | Admitting: Emergency Medicine

## 2022-08-11 ENCOUNTER — Emergency Department (HOSPITAL_BASED_OUTPATIENT_CLINIC_OR_DEPARTMENT_OTHER)
Admission: EM | Admit: 2022-08-11 | Discharge: 2022-08-11 | Disposition: A | Discharge status: Home / Self Care | Payer: Managed Care, Other (non HMO) | Attending: Emergency Medicine | Admitting: Emergency Medicine

## 2022-08-11 ENCOUNTER — Emergency Department (HOSPITAL_BASED_OUTPATIENT_CLINIC_OR_DEPARTMENT_OTHER): Payer: Managed Care, Other (non HMO)

## 2022-08-11 DIAGNOSIS — W19XXXA Unspecified fall, initial encounter: Secondary | ICD-10-CM

## 2022-08-11 DIAGNOSIS — S93402A Sprain of unspecified ligament of left ankle, initial encounter: Secondary | ICD-10-CM

## 2022-08-11 DIAGNOSIS — Y92838 Other recreation area as the place of occurrence of the external cause: Secondary | ICD-10-CM | POA: Insufficient documentation

## 2022-08-11 DIAGNOSIS — S99912A Unspecified injury of left ankle, initial encounter: Secondary | ICD-10-CM | POA: Diagnosis present

## 2022-08-11 DIAGNOSIS — W1839XA Other fall on same level, initial encounter: Secondary | ICD-10-CM | POA: Insufficient documentation

## 2022-08-11 NOTE — ED Triage Notes (Signed)
Pt c/o LT ankle pain s/p [place]

## 2022-08-11 NOTE — ED Notes (Signed)
Patient transported to X-ray 

## 2022-08-11 NOTE — ED Provider Notes (Signed)
[facility] EMERGENCY DEPARTMENT Provider Note   CSN: [id number] Arrival date & time: 08/11/22  1547     History  Chief Complaint  Patient presents with   Ankle Pain    Traci Long is a 31 y.o. female.  The history is provided by the patient. No language interpreter was used.  Ankle Pain Location:  Ankle Time since incident:  1 day Injury: yes   Mechanism of injury: fall   Ankle location:  L ankle Pain details:    Quality:  Aching   Radiates to:  Does not radiate   Severity:  Moderate   Onset quality:  Sudden   Timing:  Constant   Progression:  Partially resolved Chronicity:  New Foreign body present:  Unable to specify Tetanus status:  Unknown Prior injury to area:  Yes Relieved by:  Nothing Worsened by:  Bearing weight Ineffective treatments:  None tried Associated symptoms: swelling   Associated symptoms: no back pain, no fatigue, no fever, no neck pain, no numbness and no stiffness        Home Medications Prior to Admission medications   Medication Sig Start Date End Date Taking? Authorizing Provider  metFORMIN (GLUCOPHAGE) 500 MG tablet 1 PO WITH LUNCH DAILY 03/25/22   Opalski, Gavin Pound, DO  Vitamin D, Ergocalciferol, (DRISDOL) 1.25 MG (50000 UNIT) CAPS capsule Take 1 capsule (50,000 Units total) by mouth every 7 (seven) days. 03/25/22   Thomasene Lot, DO      Allergies    Patient has no known allergies.    Review of Systems   Review of Systems  Constitutional:  Negative for chills, fatigue and fever.  Respiratory:  Negative for shortness of breath.   Cardiovascular:  Negative for chest pain.  Gastrointestinal:  Negative for abdominal pain.  Genitourinary:  Negative for flank pain.  Musculoskeletal:  Negative for back pain, neck pain and stiffness.  Skin:  Negative for rash and wound.  Neurological:  Negative for weakness, numbness and headaches.  Psychiatric/Behavioral:  Negative for agitation.   All other systems reviewed and are  negative.   Physical Exam Updated Vital Signs BP 114/76 (BP Location: Right Arm)   Pulse (!) 115   Temp 98.4 F (36.9 C) (Oral)   Resp 18   Ht 5\' 4"  (1.626 m)   Wt 101.2 kg   LMP 07/12/2022   SpO2 98%   BMI 38.28 kg/m  Physical Exam Vitals and nursing note reviewed.  Constitutional:      General: She is not in acute distress.    Appearance: She is well-developed. She is not ill-appearing, toxic-appearing or diaphoretic.  HENT:     Head: Normocephalic and atraumatic.  Eyes:     Conjunctiva/sclera: Conjunctivae normal.  Cardiovascular:     Rate and Rhythm: Normal rate and regular rhythm.     Heart sounds: No murmur heard. Pulmonary:     Effort: Pulmonary effort is normal. No respiratory distress.     Breath sounds: Normal breath sounds. No wheezing, rhonchi or rales.  Chest:     Chest wall: No tenderness.  Abdominal:     Palpations: Abdomen is soft.     Tenderness: There is no abdominal tenderness.  Musculoskeletal:        General: Swelling, tenderness and signs of injury present.     Cervical back: Neck supple.     Right lower leg: No edema.     Left lower leg: No edema.     Left ankle: Swelling present. No lacerations.  Tenderness present.     Comments: Tenderness to left ankle with swelling.  Intact sensation, strength, and pulses.  No tenderness of the knee or hip.  No laceration.  Skin:    General: Skin is warm and dry.     Capillary Refill: Capillary refill takes less than 2 seconds.     Findings: No erythema or rash.  Neurological:     General: No focal deficit present.     Mental Status: She is alert.  Psychiatric:        Mood and Affect: Mood normal.     ED Results / Procedures / Treatments   Labs (all labs ordered are listed, but only abnormal results are displayed) Labs Reviewed - No data to display  EKG None  Radiology DG Ankle Complete Left  Result Date: 08/11/2022 CLINICAL DATA:  Left ankle pain after fall EXAM: LEFT ANKLE COMPLETE - 3+  VIEW COMPARISON:  None Available. FINDINGS: There is no evidence of fracture, dislocation, or joint effusion. There is no evidence of arthropathy or other focal bone abnormality. Diffuse soft tissue swelling about the ankle. IMPRESSION: 1. No fracture or dislocation of the left ankle. 2. Diffuse soft tissue swelling about the ankle. Electronically Signed   By: Duanne Guess D.O.   On: 08/11/2022 16:25    Procedures Procedures    Medications Ordered in ED Medications - No data to display  ED Course/ Medical Decision Making/ A&P                           Medical Decision Making Amount and/or Complexity of Data Reviewed Radiology: ordered.    Traci Long is a 31 y.o. female with a past medical history significant for Ehlers-Danlos, chronic ankle instability, and hyperlipidemia who presents with left ankle injury.  Patient reports that she was the [employer] [place] when she was trying to get away from a zombie and she slipped and fell and twisted her left ankle.  She reports she has twisted her ankle many times before but wanted make sure nothing was broken.  She reports moderate pain in her left ankle and has pain with weightbearing.  Otherwise she did not hit her head and denies any headache, neck pain, upper extremity pains, or torso pains.  She reports it is swollen but otherwise is not having numbness or weakness.  On exam, lungs clear and chest nontender.  Abdomen nontender.  Patient had swelling of the left ankle and diffuse tenderness but otherwise had intact sensation of the toes.  Good cap refill.  Good pulses.  No tenderness of the knee or hip and exam otherwise unremarkable.  There is no laceration.  Patient had x-rays in triage that showed no acute fracture but did show the soft tissue swelling.  Clinically I suspect she may have a soft tissue or ligamentous injury so we will place her in a cam walker and give her crutches.  She was over-the-counter medication and will  follow-up with sports medicine.  She agrees with plan of care and had no questions or concerns.  Patient discharged in good condition.          Final Clinical Impression(s) / ED Diagnoses Final diagnoses:  Sprain of left ankle, unspecified ligament, initial encounter  Fall, initial encounter    Clinical Impression: 1. Sprain of left ankle, unspecified ligament, initial encounter   2. Fall, initial encounter     Disposition: Discharge  Condition: Good  I have discussed the results, Dx and Tx plan with the pt(& family if present). He/she/they expressed understanding and agree(s) with the plan. Discharge instructions discussed at great length. Strict return precautions discussed and pt &/or family have verbalized understanding of the instructions. No further questions at time of discharge.    New Prescriptions   No medications on file    Follow Up: Rosemarie Ax, MD [address] [place] [address] [phone]     Marrian Salvage, [address] [place] [address] [phone]     [facility] [id number] [place] [phone]        Kimmy Totten, Gwenyth Allegra, MD 08/11/22 1745

## 2022-08-11 NOTE — Discharge Instructions (Signed)
Your history, exam, evaluation today are consistent with a left ankle sprain after your fall last night.  The x-ray did not show acute fracture dislocation but given the swelling and tenderness I do suspect you have a sprain.  We discussed the possibly of occult fracture or other ligamentous injury so please follow-up with sports medicine for further evaluation and management.  Use the cam walker and crutches and use over-the-counter medications to up with pain.  Please keep it elevated to help with the swelling.  If any symptoms change or worsen acutely, please return to the nearest emergency department.

## 2022-09-04 ENCOUNTER — Ambulatory Visit: Payer: Managed Care, Other (non HMO) | Admitting: Family Medicine

## 2022-09-05 ENCOUNTER — Ambulatory Visit: Payer: Managed Care, Other (non HMO) | Admitting: Family Medicine

## 2022-09-11 ENCOUNTER — Ambulatory Visit: Payer: Managed Care, Other (non HMO) | Admitting: Family Medicine

## 2022-09-18 ENCOUNTER — Encounter: Payer: Self-pay | Admitting: Family Medicine

## 2022-09-18 ENCOUNTER — Ambulatory Visit: Payer: Managed Care, Other (non HMO) | Admitting: Family Medicine

## 2022-09-18 ENCOUNTER — Ambulatory Visit: Payer: Self-pay

## 2022-09-18 VITALS — BP 110/64 | Ht 64.0 in | Wt 225.0 lb

## 2022-09-18 DIAGNOSIS — M25572 Pain in left ankle and joints of left foot: Secondary | ICD-10-CM

## 2022-09-18 DIAGNOSIS — M25872 Other specified joint disorders, left ankle and foot: Secondary | ICD-10-CM

## 2022-09-18 NOTE — Progress Notes (Signed)
  KAITLAN BIN - 31 y.o. female MRN [id number]  Date of birth: December 02, 1990  SUBJECTIVE:  Including CC & ROS.  No chief complaint on file.   Akiba JOVANI COLQUHOUN is a 31 y.o. female that is presenting with lateral ankle pain.  An injury over a month ago.  Continues to have lateral ankle pain.  Pain is staying the same with swelling over the lateral malleolus.  Reviewed the emergency department note from 10/22 shows she was counseled on supportive care. Independent review of the left ankle x-ray from 10/22 shows lateral soft tissue swelling  Review of Systems See HPI   HISTORY: Past Medical, Surgical, Social, and Family History Reviewed & Updated per EMR.   Pertinent Historical Findings include:  Past Medical History:  Diagnosis Date   Anxiety    Back pain    Depression    Fatigue    Knee pain    Lactose intolerance     Past Surgical History:  Procedure Laterality Date   CESAREAN SECTION       PHYSICAL EXAM:  VS: BP 110/64   Ht 5\' 4"  (1.626 m)   Wt 225 lb (102.1 kg)   BMI 38.62 kg/m  Physical Exam Gen: NAD, alert, cooperative with exam, well-appearing MSK:  Neurovascularly intact    Limited ultrasound: Left ankle:  No joint effusion. No changes of the talar dome. Calcifications appreciated distal to the lateral malleolus and within the ATFL. 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  Dynamic testing reveals for instability and pinching in the area -Set on home exercise therapy and supportive care -Counseled on compression. -Could consider injection or custom orthotics

## 2022-09-18 NOTE — Assessment & Plan Note (Signed)
Acute on chronic changes appreciated.  Dynamic testing reveals for instability and pinching in the area -Set on home exercise therapy and supportive care -Counseled on compression. -Could consider injection or custom orthotics

## 2022-09-18 NOTE — Patient Instructions (Signed)
Good to see you Please try ice as needed  Please try the exercises  Please consider compression   We can make custom orthotics going forward  Please send me a message in MyChart with any questions or updates.  Please see me back in 4 weeks or as needed if better.   --Dr. Jordan Likes

## 2022-12-13 ENCOUNTER — Encounter: Payer: Self-pay | Admitting: Family

## 2023-02-03 ENCOUNTER — Encounter: Payer: Self-pay | Admitting: *Deleted

## 2023-03-01 IMAGING — DX DG KNEE COMPLETE 4+V*L*
4 series · 4 of 4 positions shown · non-contrast
Comparison: None.

CLINICAL DATA: Left knee pain. Fall a few months ago with patellar
pain.

EXAM:
LEFT KNEE - COMPLETE 4+ VIEW

[knee ap]
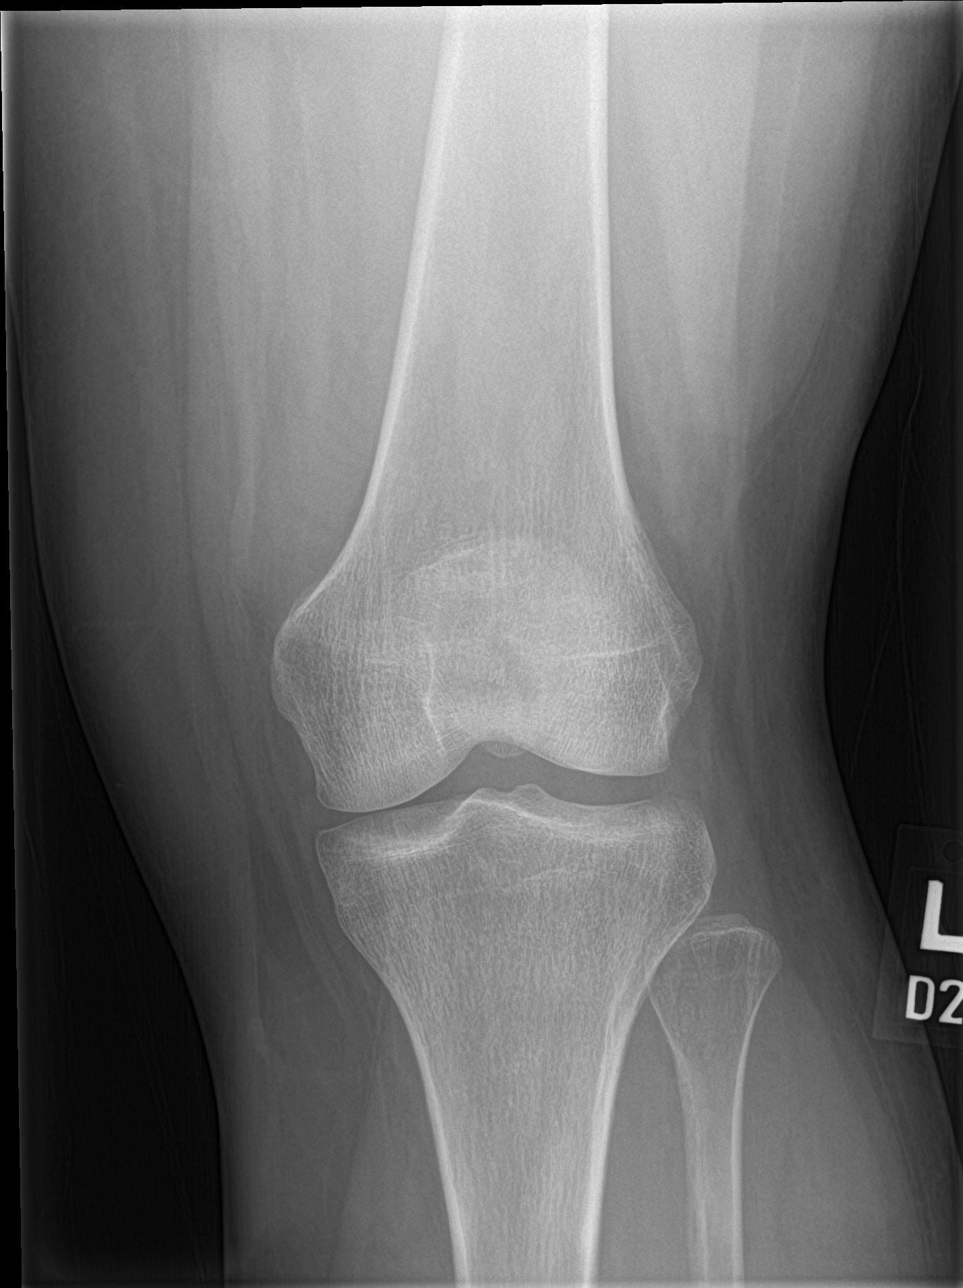

[knee lat]
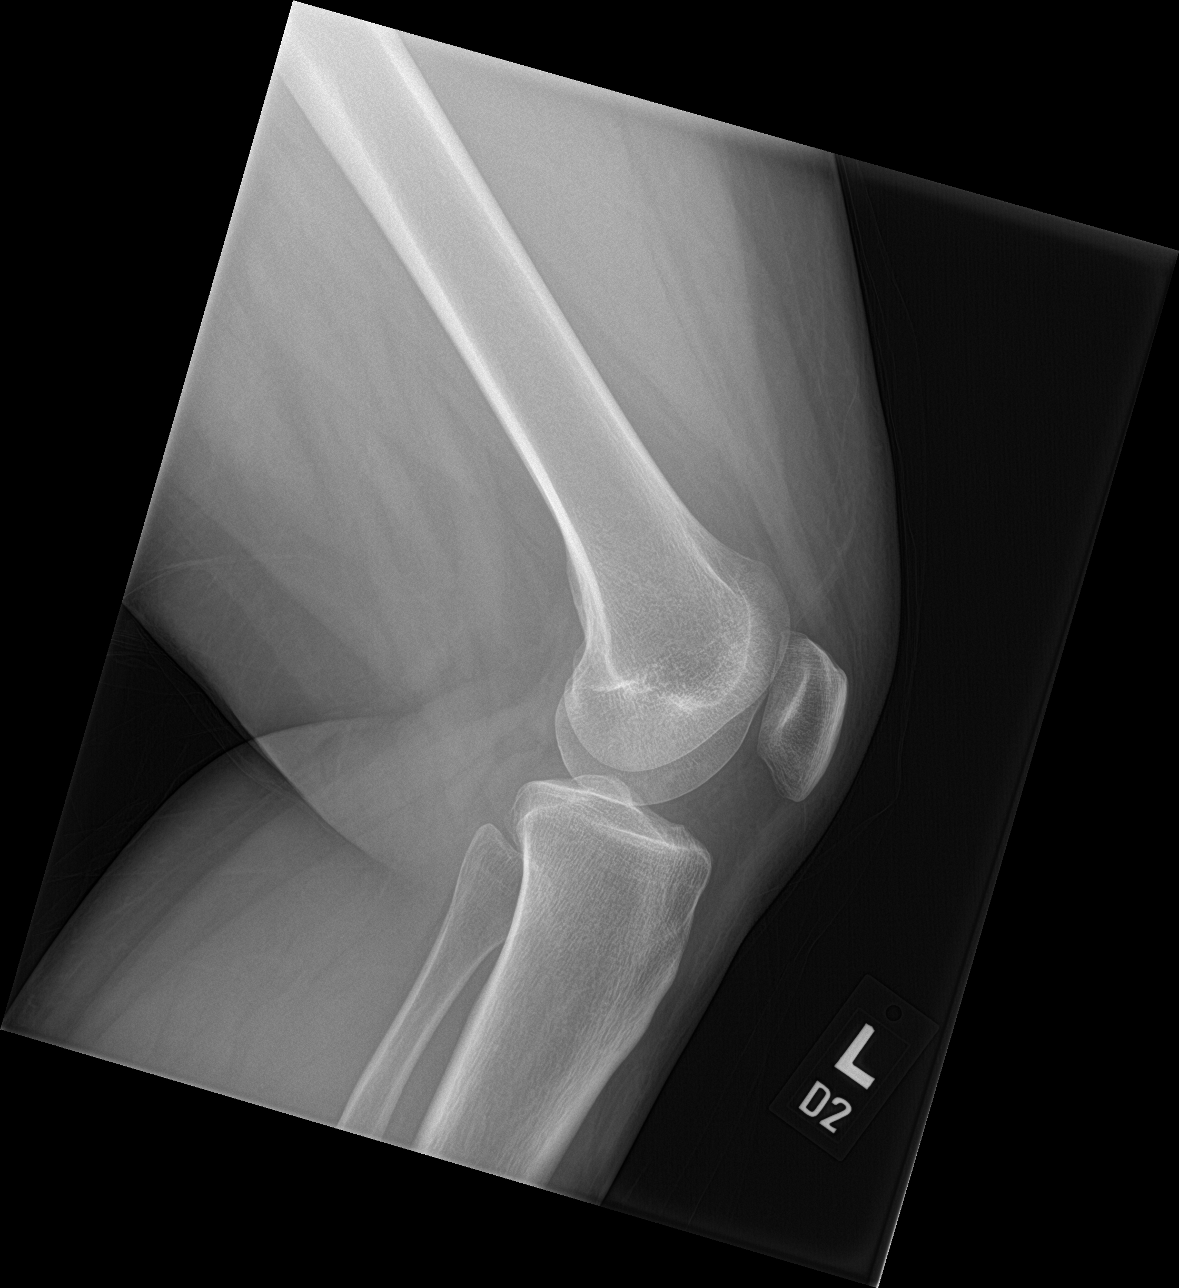

[knee obl (1 of 2)]
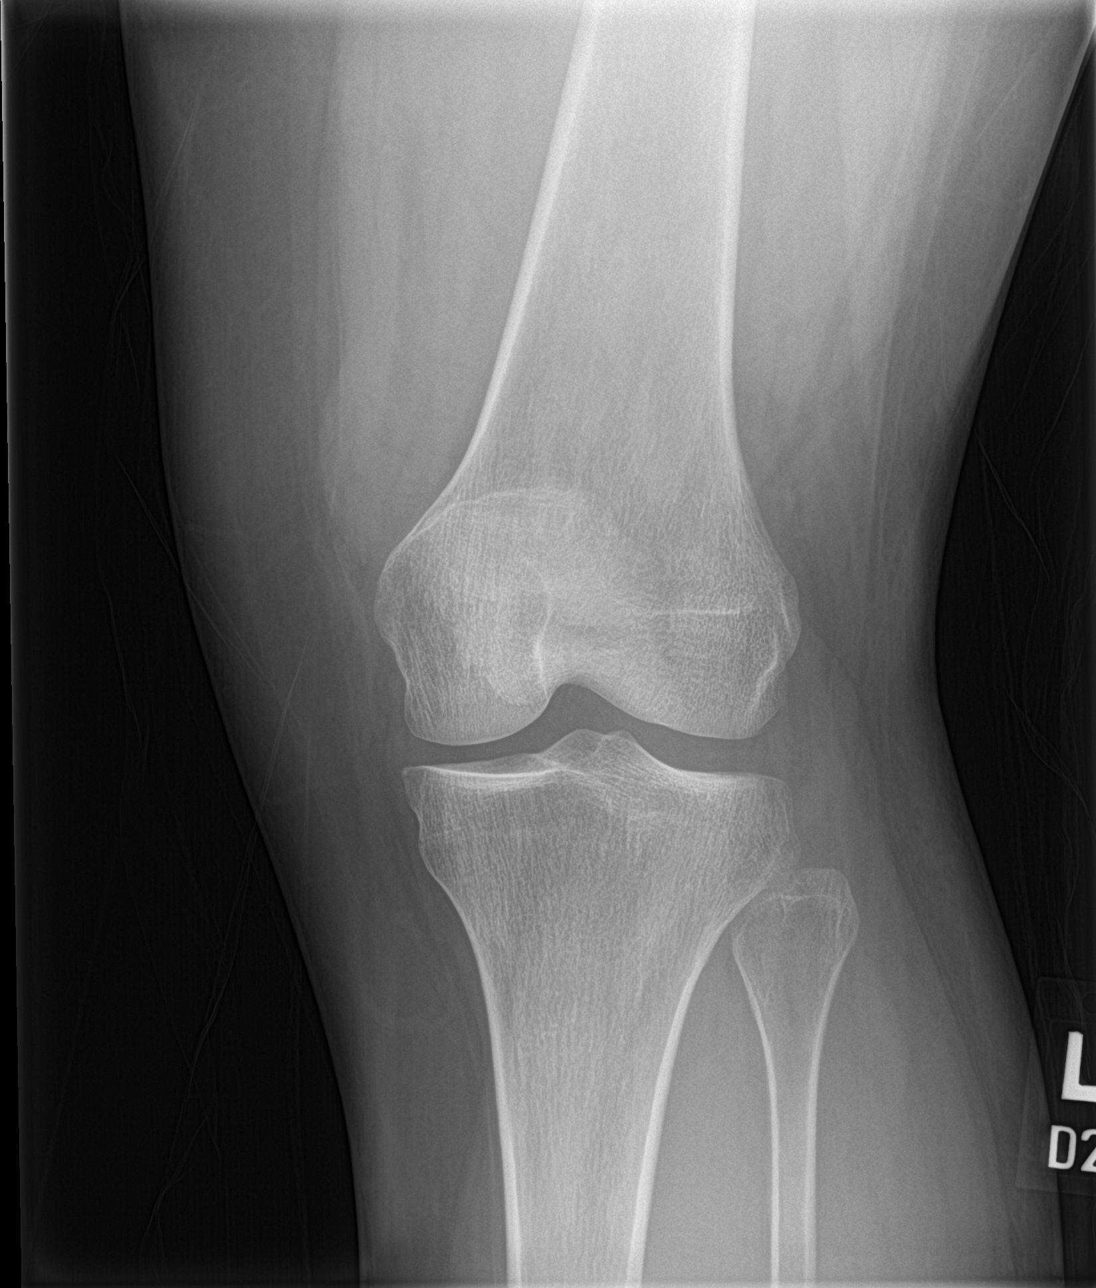

[knee obl (2 of 2)]
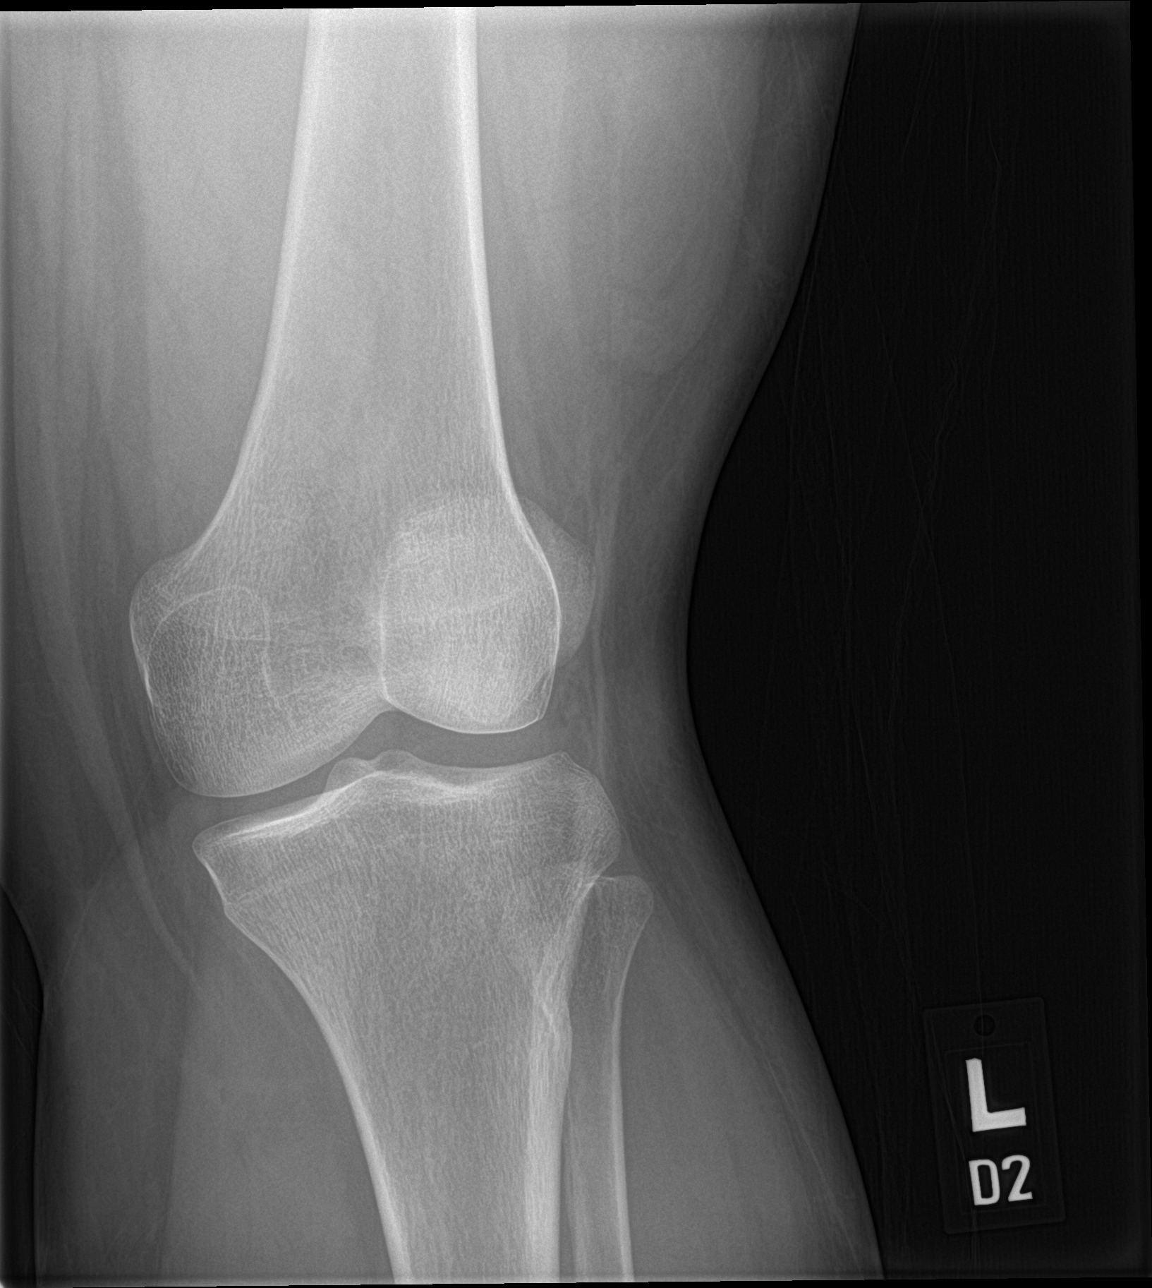

[4 of 4 positions shown; findings below may reference images not displayed]

FINDINGS: No acute or evidence of prior fracture. Normal alignment. Normal
joint spaces. There is no significant knee joint effusion. No
erosion or periostitis. No focal bone abnormality. No soft tissue
abnormalities.
IMPRESSION: Negative radiographs of the left knee.
